# Patient Record
Sex: Female | Born: 1950 | Race: White | Hispanic: No | Marital: Married | Smoking: Never smoker
Health system: Southern US, Community
[De-identification: ages and names within clinical notes are randomized; demographics above are authoritative.]

## PROBLEM LIST (undated history)

## (undated) DIAGNOSIS — E079 Disorder of thyroid, unspecified: Secondary | ICD-10-CM

## (undated) DIAGNOSIS — I4891 Unspecified atrial fibrillation: Secondary | ICD-10-CM

## (undated) DIAGNOSIS — K219 Gastro-esophageal reflux disease without esophagitis: Secondary | ICD-10-CM

## (undated) HISTORY — DX: Unspecified atrial fibrillation: I48.91

## (undated) HISTORY — DX: Disorder of thyroid, unspecified: E07.9

## (undated) HISTORY — PX: APPENDECTOMY: SHX54

## (undated) HISTORY — DX: Gastro-esophageal reflux disease without esophagitis: K21.9

---

## 2016-12-12 ENCOUNTER — Encounter: Payer: Self-pay | Admitting: Family

## 2016-12-12 ENCOUNTER — Ambulatory Visit (INDEPENDENT_AMBULATORY_CARE_PROVIDER_SITE_OTHER): Payer: BLUE CROSS/BLUE SHIELD | Admitting: Family

## 2016-12-12 VITALS — BP 130/70 | HR 80 | Temp 98.2°F | Ht 62.25 in | Wt 149.8 lb

## 2016-12-12 DIAGNOSIS — M1990 Unspecified osteoarthritis, unspecified site: Secondary | ICD-10-CM

## 2016-12-12 DIAGNOSIS — Z1231 Encounter for screening mammogram for malignant neoplasm of breast: Secondary | ICD-10-CM | POA: Diagnosis not present

## 2016-12-12 DIAGNOSIS — E039 Hypothyroidism, unspecified: Secondary | ICD-10-CM | POA: Diagnosis not present

## 2016-12-12 DIAGNOSIS — I7300 Raynaud's syndrome without gangrene: Secondary | ICD-10-CM | POA: Insufficient documentation

## 2016-12-12 DIAGNOSIS — L309 Dermatitis, unspecified: Secondary | ICD-10-CM | POA: Diagnosis not present

## 2016-12-12 DIAGNOSIS — Z1239 Encounter for other screening for malignant neoplasm of breast: Secondary | ICD-10-CM

## 2016-12-12 NOTE — Progress Notes (Signed)
Subjective:    Patient ID: Alexis Mcintosh, female    DOB: 05-17-1951, 66 y.o.   MRN: [id number]  CC: Alexis Mcintosh is a 66 y.o. female who presents today to establish care.    HPI: CC: right thumb trigger finger, waxing and waning.   Has been doing a lot of yard work 'but not sure  What happened.' No injury to her knowledge. Trying to squeezing motions makes it worse. Ibuprofen with some relief. No swelling, fever, numbness, tingling.    raynauds- on nifedia with resolve. Takes 3 per week.   Hypothyroidism- on synthroid. No cold/heat intolerance.   Eczema- stable on current topical corticosteriods.     Will return for CPE       HISTORY:  Past Medical History:  Diagnosis Date  . Thyroid disease    Past Surgical History:  Procedure Laterality Date  . APPENDECTOMY     Family History  Problem Relation Age of Onset  . Cancer Mother        Breast  . Hypertension Mother   . Diabetes Mother   . Stroke Father   . Hypertension Father     Allergies: Amoxicillin No current outpatient prescriptions on file prior to visit.   No current facility-administered medications on file prior to visit.     Social History  Substance Use Topics  . Smoking status: Never Smoker  . Smokeless tobacco: Never Used  . Alcohol use Yes    Review of Systems  Constitutional: Negative for chills and fever.  Respiratory: Negative for cough.   Cardiovascular: Negative for chest pain and palpitations.  Gastrointestinal: Negative for nausea and vomiting.  Endocrine: Negative for cold intolerance and heat intolerance.  Musculoskeletal: Negative for joint swelling.  Neurological: Negative for numbness.      Objective:    BP 130/70   Pulse 80   Temp 98.2 F (36.8 C) (Oral)   Ht 5' 2.25" (1.581 m)   Wt 149 lb 12.8 oz (67.9 kg)   SpO2 96%   BMI 27.18 kg/m  BP Readings from Last 3 Encounters:  12/12/16 130/70   Wt Readings from Last 3 Encounters:  12/12/16 149 lb 12.8 oz (67.9 kg)      Physical Exam  Constitutional: She appears well-developed and well-nourished.  Eyes: Conjunctivae are normal.  Cardiovascular: Normal rate, regular rhythm, normal heart sounds and normal pulses.   Pulmonary/Chest: Effort normal and breath sounds normal. She has no wheezes. She has no rhonchi. She has no rales.  Musculoskeletal:       Right hand: She exhibits tenderness and bony tenderness. She exhibits normal range of motion, normal capillary refill, no deformity and no swelling. Normal sensation noted.       Hands: Mild tenderness over CMJ joint. No swelling, increased warmth, erythema.   Negative phalens, tinels.   Neurological: She is alert.  Skin: Skin is warm and dry.  Psychiatric: She has a normal mood and affect. Her speech is normal and behavior is normal. Thought content normal.  Vitals reviewed.      Assessment & Plan:   Problem List Items Addressed This Visit      Endocrine   Hypothyroidism    Clinically asymptomatic. Will follow.         Musculoskeletal and Integument   Eczema   Arthritis    Symptoms consistent with CMJ arthritis. advised conservative treatement. Patient will let me know if not better.       Relevant Medications   Fenoprofen Calcium (NALFON)  200 MG CAPS capsule     Other   Raynaud phenomenon    Symptomatically stable. Will follow.       Screening for breast cancer - Primary    Ordered. Patient will schedule .      Relevant Orders   MM DIGITAL SCREENING BILATERAL       I am having Ms. Alexis Mcintosh maintain her Desonide Lot-Moisturizing Crea, triamcinolone cream, Fenoprofen Calcium, Fish Oil, Misc Natural Products (GLUCOSAMINE CHONDROITIN MSM PO), Coenzyme Q10, Vitamin D3, Melatonin, and multivitamin.   Meds ordered this encounter  Medications  . DISCONTD: SYNTHROID 125 MCG tablet  . DISCONTD: omeprazole (PRILOSEC) 20 MG capsule  . DISCONTD: NIFEdipine (PROCARDIA-XL/ADALAT-CC/NIFEDICAL-XL) 30 MG 24 hr tablet  . DISCONTD:  hydrOXYzine (ATARAX/VISTARIL) 25 MG tablet    Sig: Take 25 mg by mouth 3 (three) times daily as needed.  Marland Kitchen DISCONTD: pseudoephedrine (SUDAFED) 120 MG 12 hr tablet    Sig: Take 120 mg by mouth 2 (two) times daily.  Marland Kitchen Desonide Lot-Moisturizing Crea 0.05 % KIT    Sig: Apply topically.  Marland Kitchen DISCONTD: mometasone-formoterol (DULERA) 100-5 MCG/ACT AERO    Sig: Inhale 2 puffs into the lungs 2 (two) times daily.  Marland Kitchen triamcinolone cream (KENALOG) 0.1 %    Sig: Apply 1 application topically 2 (two) times daily.  Marland Kitchen DISCONTD: fexofenadine (ALLEGRA) 180 MG tablet    Sig: Take 180 mg by mouth daily.  . Fenoprofen Calcium (NALFON) 200 MG CAPS capsule    Sig: Take 200 mg by mouth.  . Omega-3 Fatty Acids (FISH OIL) 1000 MG CAPS    Sig: Take by mouth.  . Misc Natural Products (GLUCOSAMINE CHONDROITIN MSM PO)    Sig: Take 1,500 mg by mouth.  . Coenzyme Q10 (CVS COQ-10) 200 MG capsule    Sig: Take 200 mg by mouth daily.  . Cholecalciferol (VITAMIN D3) 2000 units TABS    Sig: Take by mouth.  . Melatonin 5 MG TABS    Sig: Take by mouth.  . Multiple Vitamin (MULTIVITAMIN) tablet    Sig: Take 1 tablet by mouth daily.    Return precautions given.   Risks, benefits, and alternatives of the medications and treatment plan prescribed today were discussed, and patient expressed understanding.   Education regarding symptom management and diagnosis given to patient on AVS.  Continue to follow with Alexis Hawthorne, FNP for routine health maintenance.   Alexis Mcintosh and I agreed with plan.   Alexis Paris, FNP

## 2016-12-12 NOTE — Patient Instructions (Addendum)
So nice meeting you  Conservative therapy for thumb- let me know if not better  Follow up with physical and fasting labs.  We placed a referral. Mammogram this year. I asked that you call one the below locations and schedule this when it is convenient for you.   If you have dense breasts, you may ask for 3D mammogram over the traditional 2D mammogram as new evidence suggest 3D is superior. Please note that NOT all insurance companies cover 3D and you may have to pay a higher copay. You may call your insurance company to further clarify your benefits.   Options for Mammogram.    [facility]  [address]  [place], [place]  [phone]  * [facility]   [facility] [address] [place], [place] [phone] * Note if you ask for 3D mammogram at this location, you must request [place], [place] location*

## 2016-12-12 NOTE — Assessment & Plan Note (Addendum)
Symptoms consistent with CMJ arthritis. advised conservative treatement. Patient will let me know if not better.

## 2016-12-13 ENCOUNTER — Other Ambulatory Visit: Payer: Self-pay

## 2016-12-13 ENCOUNTER — Encounter: Payer: Self-pay | Admitting: Family

## 2016-12-13 MED ORDER — HYDROXYZINE HCL 25 MG PO TABS: 25.0000 mg | ORAL_TABLET | Freq: Three times a day (TID) | ORAL | 1 refills | Status: DC | PRN

## 2016-12-13 MED ORDER — OMEPRAZOLE 20 MG PO CPDR: 20.0000 mg | DELAYED_RELEASE_CAPSULE | Freq: Every day | ORAL | 1 refills | Status: DC

## 2016-12-13 MED ORDER — PSEUDOEPHEDRINE HCL ER 120 MG PO TB12: 120.0000 mg | ORAL_TABLET | Freq: Two times a day (BID) | ORAL | 1 refills | Status: DC

## 2016-12-13 MED ORDER — SYNTHROID 125 MCG PO TABS: 125.0000 ug | ORAL_TABLET | Freq: Every day | ORAL | 1 refills | Status: DC

## 2016-12-13 MED ORDER — MOMETASONE FURO-FORMOTEROL FUM 100-5 MCG/ACT IN AERO: 2.0000 | INHALATION_SPRAY | Freq: Two times a day (BID) | RESPIRATORY_TRACT | 3 refills | Status: DC

## 2016-12-13 MED ORDER — NIFEDIPINE ER OSMOTIC RELEASE 30 MG PO TB24: 30.0000 mg | ORAL_TABLET | Freq: Every day | ORAL | 1 refills | Status: DC

## 2016-12-13 MED ORDER — FEXOFENADINE HCL 180 MG PO TABS: 180.0000 mg | ORAL_TABLET | Freq: Every day | ORAL | 1 refills | Status: DC

## 2016-12-13 NOTE — Telephone Encounter (Signed)
Medication has been refilled.

## 2016-12-16 ENCOUNTER — Encounter: Payer: Self-pay | Admitting: Family

## 2016-12-16 DIAGNOSIS — Z1239 Encounter for other screening for malignant neoplasm of breast: Secondary | ICD-10-CM | POA: Insufficient documentation

## 2016-12-16 NOTE — Assessment & Plan Note (Signed)
Ordered.  Patient will schedule. 

## 2016-12-16 NOTE — Assessment & Plan Note (Signed)
Clinically asymptomatic. Will follow 

## 2016-12-16 NOTE — Assessment & Plan Note (Signed)
Symptomatically stable. Will follow 

## 2016-12-24 ENCOUNTER — Telehealth: Payer: Self-pay | Admitting: Family

## 2016-12-24 MED ORDER — PSEUDOEPHEDRINE HCL ER 120 MG PO TB12: 120.0000 mg | ORAL_TABLET | Freq: Two times a day (BID) | ORAL | 1 refills | Status: DC

## 2016-12-24 NOTE — Telephone Encounter (Signed)
Refill sent as requested , patient notified,

## 2016-12-24 NOTE — Telephone Encounter (Signed)
Pt needs a refill for pseudoephedrine (SUDAFED) 120 MG 12 hr tablet 90 day supply  Pharmacy is [id number] - [place], [place] - [address]  Call pt @ [phone]. Thank you!

## 2017-01-13 NOTE — Telephone Encounter (Signed)
Awaiting mychart reply.

## 2017-01-13 NOTE — Telephone Encounter (Signed)
Pt would like to have all of her medications in generic form please. Thank you!

## 2017-01-15 ENCOUNTER — Telehealth: Payer: Self-pay | Admitting: Family

## 2017-01-15 NOTE — Telephone Encounter (Signed)
Spoken to patient she is calling her last PCP in [place] for date then have records sent to us here.

## 2017-01-15 NOTE — Telephone Encounter (Signed)
Pt has an order to get a mammo. I need to know pt last mammo. Pt states [place] was in [place] but not sure of the year. Would you know the year she got her last mammo? Thank you!

## 2017-01-15 NOTE — Telephone Encounter (Signed)
Ok. Yes I spoke to her about getting that information to. Pt stated that we should've had her records also, that's why I wanted to check if we had any information. Ok! Thank you so much!

## 2017-01-16 MED ORDER — OMEPRAZOLE 20 MG PO CPDR: 20.0000 mg | DELAYED_RELEASE_CAPSULE | Freq: Every day | ORAL | 1 refills | Status: DC

## 2017-01-16 MED ORDER — LEVOTHYROXINE SODIUM 125 MCG PO TABS: 125.0000 ug | ORAL_TABLET | Freq: Every day | ORAL | 1 refills | Status: DC

## 2017-01-16 NOTE — Telephone Encounter (Signed)
Medication has been refilled and changed to generic.

## 2017-03-11 ENCOUNTER — Telehealth: Payer: Self-pay | Admitting: Family

## 2017-03-11 DIAGNOSIS — L309 Dermatitis, unspecified: Secondary | ICD-10-CM

## 2017-03-11 DIAGNOSIS — J45909 Unspecified asthma, uncomplicated: Secondary | ICD-10-CM

## 2017-03-11 NOTE — Telephone Encounter (Signed)
Pt called about needing refill for mometasone-formoterol (DULERA) 100-5 MCG/ACT AERO, triamcinolone cream (KENALOG) 0.1 %.  [place] [place] [facility] [place] [address]  Call pt @ [phone]. Thank you!

## 2017-03-11 NOTE — Telephone Encounter (Signed)
Last seen 15may2018. Please advise.

## 2017-03-12 MED ORDER — TRIAMCINOLONE ACETONIDE 0.1 % EX CREA: 1.0000 "application " | TOPICAL_CREAM | Freq: Two times a day (BID) | CUTANEOUS | 1 refills | Status: DC

## 2017-03-12 MED ORDER — MOMETASONE FURO-FORMOTEROL FUM 100-5 MCG/ACT IN AERO: 2.0000 | INHALATION_SPRAY | Freq: Two times a day (BID) | RESPIRATORY_TRACT | 3 refills | Status: DC

## 2017-03-12 NOTE — Telephone Encounter (Signed)
Left message for patient to return call back.  

## 2017-03-12 NOTE — Telephone Encounter (Signed)
Call pt-  Need more info   I presume she uses dulera for asthma ?  What is the steroid cream used for ?  That is rather high dose steroid cream. Please ensure she is aware that steroids Can cause skin discoloration and should not be used on face.

## 2017-03-12 NOTE — Telephone Encounter (Signed)
Please call pt at [phone]

## 2017-03-12 NOTE — Telephone Encounter (Signed)
Patient uses steroid cream for eczema and the nasonex for breathing.

## 2017-03-12 NOTE — Telephone Encounter (Signed)
refilled 

## 2017-03-18 MED ORDER — MOMETASONE FURO-FORMOTEROL FUM 100-5 MCG/ACT IN AERO: 2.0000 | INHALATION_SPRAY | Freq: Two times a day (BID) | RESPIRATORY_TRACT | 3 refills | Status: DC

## 2017-03-18 MED ORDER — TRIAMCINOLONE ACETONIDE 0.1 % EX CREA: 1.0000 "application " | TOPICAL_CREAM | Freq: Two times a day (BID) | CUTANEOUS | 1 refills | Status: DC

## 2017-03-18 NOTE — Telephone Encounter (Signed)
[facility]

## 2017-03-18 NOTE — Telephone Encounter (Signed)
Order has been resent to escript[facility]

## 2017-03-25 ENCOUNTER — Ambulatory Visit
Admission: RE | Admit: 2017-03-25 | Discharge: 2017-03-25 | Disposition: A | Discharge status: Home / Self Care | Payer: BLUE CROSS/BLUE SHIELD | Source: Ambulatory Visit | Attending: Family | Admitting: Family

## 2017-03-25 ENCOUNTER — Telehealth: Payer: Self-pay | Admitting: Family

## 2017-03-25 ENCOUNTER — Encounter: Payer: Self-pay | Admitting: Radiology

## 2017-03-25 DIAGNOSIS — Z1231 Encounter for screening mammogram for malignant neoplasm of breast: Secondary | ICD-10-CM | POA: Insufficient documentation

## 2017-03-25 DIAGNOSIS — Z1239 Encounter for other screening for malignant neoplasm of breast: Secondary | ICD-10-CM

## 2017-03-25 MED ORDER — MOMETASONE FUROATE 50 MCG/ACT NA SUSP: 2.0000 | Freq: Every day | NASAL | 3 refills | Status: DC

## 2017-03-25 NOTE — Telephone Encounter (Signed)
Pt called back and stated that she cannot returning that medication. Please advise, thank you!

## 2017-03-25 NOTE — Telephone Encounter (Signed)
Patient came into facility stating she received Dulera Inhaler instead of Nasonex nasal spray, reviewed chart patient has no HX of asthma or COPD, patient ask that medication be sent to pharmacy while she was here for Nasonex spoke with provider and was given verbal ok to fill Nasonex.

## 2017-03-26 NOTE — Telephone Encounter (Signed)
Safety zone portal completed yesterday. thanks

## 2017-04-01 ENCOUNTER — Other Ambulatory Visit: Payer: Self-pay | Admitting: *Deleted

## 2017-04-01 ENCOUNTER — Inpatient Hospital Stay
Admission: RE | Admit: 2017-04-01 | Discharge: 2017-04-01 | Disposition: A | Discharge status: Home / Self Care | Payer: Self-pay | Source: Ambulatory Visit | Attending: *Deleted | Admitting: *Deleted

## 2017-04-01 DIAGNOSIS — Z9289 Personal history of other medical treatment: Secondary | ICD-10-CM

## 2017-06-02 ENCOUNTER — Other Ambulatory Visit: Payer: Self-pay | Admitting: Family

## 2017-06-10 ENCOUNTER — Telehealth: Payer: Self-pay | Admitting: Family

## 2017-06-10 DIAGNOSIS — E039 Hypothyroidism, unspecified: Secondary | ICD-10-CM

## 2017-06-10 NOTE — Telephone Encounter (Signed)
Copied from CRM #[id number]. Topic: General - Other >> Jun 10, 2017  4:35 PM Clack, Jessica D wrote: Reason for CRM: Patient is requesting a med refill on her levothyroxine to be sent to [facility] - [place], [place] - [address] [phone] (Phone) [phone] (Fax)   

## 2017-06-10 NOTE — Telephone Encounter (Signed)
Please advise 

## 2017-06-11 MED ORDER — LEVOTHYROXINE SODIUM 125 MCG PO TABS: 125.0000 ug | ORAL_TABLET | Freq: Every day | ORAL | 1 refills | Status: DC

## 2017-06-11 NOTE — Telephone Encounter (Signed)
Copied from CRM [phone]. Topic: General - Other >> Jun 10, 2017  4:35 PM Clack, Princella PellegriniJessica D wrote: Reason for CRM: Patient is requesting a med refill on her [facility]othyroxine [facility] [place] [address] [phone] (Phone) [phone] (Fax)

## 2017-06-11 NOTE — Telephone Encounter (Signed)
rx sent  No recent tsh. Please make her a f/u appt with colleague to ensure doing well on current dose and have tsh checked

## 2017-06-12 NOTE — Telephone Encounter (Signed)
Please schedule patient for follow up.

## 2017-06-16 ENCOUNTER — Ambulatory Visit (INDEPENDENT_AMBULATORY_CARE_PROVIDER_SITE_OTHER): Payer: BLUE CROSS/BLUE SHIELD | Admitting: Family

## 2017-06-16 ENCOUNTER — Encounter: Payer: Self-pay | Admitting: Family

## 2017-06-16 VITALS — BP 122/68 | HR 83 | Temp 98.3°F | Ht 62.0 in | Wt 148.4 lb

## 2017-06-16 DIAGNOSIS — L309 Dermatitis, unspecified: Secondary | ICD-10-CM

## 2017-06-16 DIAGNOSIS — I7300 Raynaud's syndrome without gangrene: Secondary | ICD-10-CM | POA: Diagnosis not present

## 2017-06-16 DIAGNOSIS — J302 Other seasonal allergic rhinitis: Secondary | ICD-10-CM | POA: Diagnosis not present

## 2017-06-16 DIAGNOSIS — K219 Gastro-esophageal reflux disease without esophagitis: Secondary | ICD-10-CM | POA: Insufficient documentation

## 2017-06-16 DIAGNOSIS — E039 Hypothyroidism, unspecified: Secondary | ICD-10-CM | POA: Diagnosis not present

## 2017-06-16 DIAGNOSIS — L509 Urticaria, unspecified: Secondary | ICD-10-CM

## 2017-06-16 DIAGNOSIS — L989 Disorder of the skin and subcutaneous tissue, unspecified: Secondary | ICD-10-CM | POA: Diagnosis not present

## 2017-06-16 MED ORDER — FEXOFENADINE HCL 180 MG PO TABS: 180.0000 mg | ORAL_TABLET | Freq: Every day | ORAL | 1 refills | Status: DC

## 2017-06-16 MED ORDER — LEVOTHYROXINE SODIUM 125 MCG PO TABS: 125.0000 ug | ORAL_TABLET | Freq: Every day | ORAL | 1 refills | Status: DC

## 2017-06-16 MED ORDER — PSEUDOEPHEDRINE HCL ER 120 MG PO TB12: 120.0000 mg | ORAL_TABLET | Freq: Two times a day (BID) | ORAL | 1 refills | Status: DC

## 2017-06-16 MED ORDER — MOMETASONE FUROATE 50 MCG/ACT NA SUSP: 2.0000 | Freq: Every day | NASAL | 3 refills | Status: DC

## 2017-06-16 MED ORDER — TRIAMCINOLONE ACETONIDE 0.1 % EX CREA: 1.0000 "application " | TOPICAL_CREAM | Freq: Two times a day (BID) | CUTANEOUS | 1 refills | Status: DC

## 2017-06-16 MED ORDER — NIFEDIPINE ER OSMOTIC RELEASE 30 MG PO TB24: 30.0000 mg | ORAL_TABLET | Freq: Every day | ORAL | 1 refills | Status: DC

## 2017-06-16 MED ORDER — DESONIDE 0.05 % EX LOTN: TOPICAL_LOTION | Freq: Two times a day (BID) | CUTANEOUS | 0 refills | Status: DC

## 2017-06-16 MED ORDER — HYDROXYZINE HCL 50 MG PO TABS: 50.0000 mg | ORAL_TABLET | Freq: Every day | ORAL | 2 refills | Status: DC

## 2017-06-16 NOTE — Assessment & Plan Note (Signed)
Stable. Education provided on long-term use of PPIs. Declines EGD at this time. Advised patient to consider in the future.

## 2017-06-16 NOTE — Assessment & Plan Note (Signed)
Controlled on current regimen. Refilled topical corticosteroids

## 2017-06-16 NOTE — Addendum Note (Signed)
Addended by: Elise BenneBOOTH, Duy Lemming T on: 06/16/2017 03:11 PM   Modules accepted: Orders

## 2017-06-16 NOTE — Progress Notes (Signed)
Subjective:    Patient ID: Natacia Chaisson, female    DOB: 1951/02/16, 66 y.o.   MRN: [id number]  CC: Bettyanne Dittman is a 66 y.o. female who presents today for follow up.   HPI: Two new lesions on right lower extremity.  Not bleeding, nor itchy.   No h/o skin cancer  PGM had skin cancer.   GERD- on prilosec every day. Last EGD several years. No hoarseness, trouble swallowing. Non smoker.   Would like refills on all medications for one year.  Hives- chronic. Takes Atarax daily. States it is not sedating for her. Hypothyroidism-complaint with medication. No weight changes, cold/ heat intolerance          HISTORY:  Past Medical History:  Diagnosis Date  . Thyroid disease    Past Surgical History:  Procedure Laterality Date  . APPENDECTOMY     Family History  Problem Relation Age of Onset  . Cancer Mother        Breast  . Hypertension Mother   . Diabetes Mother   . Breast cancer Mother 47  . Stroke Father   . Hypertension Father   . Breast cancer Paternal Grandmother 90    Allergies: Amoxicillin Current Outpatient Medications on File Prior to Visit  Medication Sig Dispense Refill  . Cholecalciferol (VITAMIN D3) 2000 units TABS Take by mouth.    . Coenzyme Q10 (CVS COQ-10) 200 MG capsule Take 200 mg by mouth daily.    Marland Kitchen Desonide Lot-Moisturizing Crea 0.05 % KIT Apply topically.    . Fenoprofen Calcium (NALFON) 200 MG CAPS capsule Take 200 mg by mouth.    . Melatonin 5 MG TABS Take by mouth.    . Misc Natural Products (GLUCOSAMINE CHONDROITIN MSM PO) Take 1,500 mg by mouth.    . Multiple Vitamin (MULTIVITAMIN) tablet Take 1 tablet by mouth daily.    . Omega-3 Fatty Acids (FISH OIL) 1000 MG CAPS Take by mouth.    Marland Kitchen omeprazole (PRILOSEC) 20 MG capsule Take 1 capsule (20 mg total) by mouth daily. 90 capsule 1   No current facility-administered medications on file prior to visit.     Social History   Tobacco Use  . Smoking status: Never Smoker  . Smokeless  tobacco: Never Used  Substance Use Topics  . Alcohol use: Yes  . Drug use: No    Review of Systems  Constitutional: Negative for chills and fever.  HENT: Negative for trouble swallowing.   Respiratory: Negative for cough.   Cardiovascular: Negative for chest pain and palpitations.  Gastrointestinal: Negative for nausea and vomiting.  Endocrine: Negative for cold intolerance and heat intolerance.  Skin: Positive for color change.      Objective:    BP 122/68   Pulse 83   Temp 98.3 F (36.8 C) (Oral)   Ht _0  (1.575 m)   Wt 148 lb 6.4 oz (67.3 kg)   SpO2 97%   BMI 27.14 kg/m  BP Readings from Last 3 Encounters:  06/16/17 122/68  12/12/16 130/70   Wt Readings from Last 3 Encounters:  06/16/17 148 lb 6.4 oz (67.3 kg)  12/12/16 149 lb 12.8 oz (67.9 kg)    Physical Exam  Constitutional: She appears well-developed and well-nourished.  Eyes: Conjunctivae are normal.  Cardiovascular: Normal rate, regular rhythm, normal heart sounds and normal pulses.  Pulmonary/Chest: Effort normal and breath sounds normal. She has no wheezes. She has no rhonchi. She has no rales.  Neurological: She is alert.  Skin: Skin  is warm and dry.     Brown colored macular lesions noted as on diagram  Psychiatric: She has a normal mood and affect. Her speech is normal and behavior is normal. Thought content normal.  Vitals reviewed.      Assessment & Plan:   Problem List Items Addressed This Visit      Digestive   GERD (gastroesophageal reflux disease)    Stable. Education provided on long-term use of PPIs. Declines EGD at this time. Advised patient to consider in the future.        Endocrine   Hypothyroidism    Asymptomatic. Prefers to do thyroid studies with physical next year      Relevant Medications   levothyroxine (SYNTHROID, LEVOTHROID) 125 MCG tablet     Musculoskeletal and Integument   Eczema    Controlled on current regimen. Refilled topical corticosteroids       Relevant Medications   triamcinolone cream (KENALOG) 0.1 %   desonide (DESOWEN) 0.05 % lotion   Hives - Primary    Controlled on daily dose of Atarax. We'll continue      Relevant Medications   hydrOXYzine (ATARAX/VISTARIL) 50 MG tablet   Skin lesion    New. Advised dermatology consult to ensure no biopsy needed. Patient declines at this time will let me know if changes        Other   Raynaud phenomenon   Relevant Medications   NIFEdipine (PROCARDIA-XL/ADALAT-CC/NIFEDICAL-XL) 30 MG 24 hr tablet    Other Visit Diagnoses    Seasonal allergies       Relevant Medications   pseudoephedrine (SUDAFED) 120 MG 12 hr tablet   mometasone (NASONEX) 50 MCG/ACT nasal spray   fexofenadine (ALLEGRA) 180 MG tablet       I have discontinued Dorine Dekker's hydrOXYzine. I have also changed her NIFEdipine, pseudoephedrine, triamcinolone cream, mometasone, and fexofenadine. Additionally, I am having her start on hydrOXYzine and desonide. Lastly, I am having her maintain her Desonide Lot-Moisturizing Crea, Fenoprofen Calcium, Fish Oil, Misc Natural Products (GLUCOSAMINE CHONDROITIN MSM PO), Coenzyme Q10, Vitamin D3, Melatonin, multivitamin, omeprazole, and levothyroxine.   Meds ordered this encounter  Medications  . levothyroxine (SYNTHROID, LEVOTHROID) 125 MCG tablet    Sig: Take 1 tablet (125 mcg total) daily by mouth.    Dispense:  90 tablet    Refill:  1    Order Specific Question:   Supervising Provider    Answer:   Deborra Medina L [[id number]]  . NIFEdipine (PROCARDIA-XL/ADALAT-CC/NIFEDICAL-XL) 30 MG 24 hr tablet    Sig: Take 1 tablet (30 mg total) daily by mouth.    Dispense:  90 tablet    Refill:  1    Order Specific Question:   Supervising Provider    Answer:   Deborra Medina L [[id number]]  . hydrOXYzine (ATARAX/VISTARIL) 50 MG tablet    Sig: Take 1 tablet (50 mg total) daily by mouth.    Dispense:  90 tablet    Refill:  2    Order Specific Question:   Supervising Provider    Answer:    Deborra Medina L [[id number]]  . pseudoephedrine (SUDAFED) 120 MG 12 hr tablet    Sig: Take 1 tablet (120 mg total) 2 (two) times daily by mouth.    Dispense:  180 tablet    Refill:  1    Order Specific Question:   Supervising Provider    Answer:   Deborra Medina L [[id number]]  . triamcinolone cream (KENALOG) 0.1 %    Sig:  Apply 1 application 2 (two) times daily topically.    Dispense:  30 g    Refill:  1    Order Specific Question:   Supervising Provider    Answer:   Derrel Nip, TERESA L [[id number]]  . mometasone (NASONEX) 50 MCG/ACT nasal spray    Sig: Place 2 sprays daily into the nose.    Dispense:  51 g    Refill:  3    Order Specific Question:   Supervising Provider    Answer:   Derrel Nip, TERESA L [[id number]]  . fexofenadine (ALLEGRA) 180 MG tablet    Sig: Take 1 tablet (180 mg total) daily by mouth.    Dispense:  90 tablet    Refill:  1    Order Specific Question:   Supervising Provider    Answer:   Deborra Medina L [[id number]]  . desonide (DESOWEN) 0.05 % lotion    Sig: Apply 2 (two) times daily topically.    Dispense:  59 mL    Refill:  0    Order Specific Question:   Supervising Provider    Answer:   Crecencio Mc [[id number]]    Return precautions given.   Risks, benefits, and alternatives of the medications and treatment plan prescribed today were discussed, and patient expressed understanding.   Education regarding symptom management and diagnosis given to patient on AVS.  Continue to follow with Burnard Hawthorne, FNP for routine health maintenance.   Clayborne Dana and I agreed with plan.   Mable Paris, FNP

## 2017-06-16 NOTE — Progress Notes (Signed)
Pre visit review using our clinic review tool, if applicable. No additional management support is needed unless otherwise documented below in the visit note. 

## 2017-06-16 NOTE — Assessment & Plan Note (Signed)
Controlled on daily dose of Atarax. We'll continue

## 2017-06-16 NOTE — Patient Instructions (Addendum)
Long term use beyond 3 months of proton pump inhibitors , also called PPI's, is associated with malabsorption of vitamins, chronic kidney disease, fracture risk, and diarrheal illnesses. PPI's include Nexium, Prilosec, Protonix, Dexilant, and Prevacid.   I generally recommend trying to control acid reflux with lifestyle modifications including avoiding trigger foods, not eating 2 hours prior to bedtime. You may use histamine 2 blockers daily to twice daily ( this is Zantac, Pepcid) and then when symptoms flare, start back on PPI for short course.    Let me know about EGD and let me know dermatologist as well.

## 2017-06-16 NOTE — Assessment & Plan Note (Signed)
New. Advised dermatology consult to ensure no biopsy needed. Patient declines at this time will let me know if changes

## 2017-06-16 NOTE — Assessment & Plan Note (Signed)
Asymptomatic. Prefers to do thyroid studies with physical next year

## 2017-08-01 ENCOUNTER — Other Ambulatory Visit: Payer: Self-pay | Admitting: Family

## 2017-11-26 ENCOUNTER — Other Ambulatory Visit: Payer: Self-pay | Admitting: Family

## 2017-11-26 DIAGNOSIS — J302 Other seasonal allergic rhinitis: Secondary | ICD-10-CM

## 2018-01-28 ENCOUNTER — Other Ambulatory Visit: Payer: Self-pay | Admitting: Family

## 2018-02-13 ENCOUNTER — Other Ambulatory Visit: Payer: Self-pay | Admitting: Family

## 2018-02-13 DIAGNOSIS — E039 Hypothyroidism, unspecified: Secondary | ICD-10-CM

## 2018-04-29 DIAGNOSIS — R69 Illness, unspecified: Secondary | ICD-10-CM | POA: Diagnosis not present

## 2018-05-14 ENCOUNTER — Other Ambulatory Visit: Payer: Self-pay | Admitting: Family

## 2018-05-14 DIAGNOSIS — E039 Hypothyroidism, unspecified: Secondary | ICD-10-CM

## 2018-06-01 ENCOUNTER — Telehealth: Payer: Self-pay | Admitting: Family

## 2018-06-01 ENCOUNTER — Other Ambulatory Visit: Payer: Self-pay | Admitting: Family

## 2018-06-01 DIAGNOSIS — Z1231 Encounter for screening mammogram for malignant neoplasm of breast: Secondary | ICD-10-CM

## 2018-06-01 DIAGNOSIS — L509 Urticaria, unspecified: Secondary | ICD-10-CM

## 2018-06-01 NOTE — Telephone Encounter (Signed)
Copied from CRM [id number]. Topic: Quick Communication - See Telephone Encounter >> Jun 01, 2018  2:03 PM Luanna Cole wrote: CRM for notification. See Telephone encounter for: 06/01/18. Pt called and stated that she needs a prior authorization for medication hydrOXYzine (ATARAX/VISTARIL) 50 MG tablet [[id number]] pt states that insurance said it was a DUR rejection and the provider would need to call [phone]. Please advise

## 2018-06-04 NOTE — Telephone Encounter (Signed)
PEC may speak with patient.  Left message for patient to call , need to speak with patient in regards to prior authorization to find out what pharmacy she is using. Need member ID to precede with prior authorization. Thanks

## 2018-06-04 NOTE — Telephone Encounter (Signed)
Member Id-[id number] Pharmacy [facility] [place], [place].

## 2018-06-05 NOTE — Telephone Encounter (Signed)
Prior authorization submitted.

## 2018-06-08 DIAGNOSIS — H04123 Dry eye syndrome of bilateral lacrimal glands: Secondary | ICD-10-CM | POA: Diagnosis not present

## 2018-06-08 NOTE — Telephone Encounter (Signed)
Pt states that she is calling to check on this since she hasn't heard and states she wants to confirm that this is for 2 pills a day and a 3 month RX.

## 2018-06-09 NOTE — Telephone Encounter (Signed)
Patient comes by office states she is completely out of hydroxyzine . She states she takes two per day. Wonders we can call in script for hydroxyzine 50mg  bid to [facility] [address] . She will pay out of pocket until we hear from prior authorization. Thanks.

## 2018-06-09 NOTE — Telephone Encounter (Signed)
Since patient has not been seen at office since November 2018 and I have never met her, I will forward to [facility] to see if she will send in.  I am unsure why exactly she is taking it so I am uncomfortable sending.

## 2018-06-09 NOTE — Telephone Encounter (Signed)
Left voicemail for patient that message has been forwarded to Claris Che , NP and she is out of office.

## 2018-06-10 MED ORDER — HYDROXYZINE HCL 50 MG PO TABS: 50.0000 mg | ORAL_TABLET | Freq: Every day | ORAL | 0 refills | Status: DC

## 2018-06-10 NOTE — Telephone Encounter (Signed)
Left voicemail for patient to contact office

## 2018-06-10 NOTE — Telephone Encounter (Signed)
Prior authorization approved. Prior authorization approved for one tablet daily .

## 2018-06-10 NOTE — Telephone Encounter (Signed)
Call pt I have given her 90-day supply of the Atarax until she is seen.  However please explain that she will need to follow-up with me.  For me to prescribe medications, need to see her at least annually.  Please schedule follow-up.

## 2018-06-15 NOTE — Telephone Encounter (Signed)
F/u appt 07/08/18

## 2018-06-19 ENCOUNTER — Other Ambulatory Visit: Payer: Self-pay | Admitting: Family

## 2018-06-19 DIAGNOSIS — L509 Urticaria, unspecified: Secondary | ICD-10-CM

## 2018-07-08 ENCOUNTER — Other Ambulatory Visit: Payer: Self-pay

## 2018-07-08 ENCOUNTER — Encounter: Payer: Self-pay | Admitting: Family

## 2018-07-08 ENCOUNTER — Ambulatory Visit (INDEPENDENT_AMBULATORY_CARE_PROVIDER_SITE_OTHER): Payer: Medicare HMO | Admitting: Family

## 2018-07-08 VITALS — BP 118/72 | HR 72 | Wt 154.0 lb

## 2018-07-08 DIAGNOSIS — J302 Other seasonal allergic rhinitis: Secondary | ICD-10-CM

## 2018-07-08 DIAGNOSIS — E039 Hypothyroidism, unspecified: Secondary | ICD-10-CM | POA: Diagnosis not present

## 2018-07-08 DIAGNOSIS — Z23 Encounter for immunization: Secondary | ICD-10-CM

## 2018-07-08 DIAGNOSIS — L509 Urticaria, unspecified: Secondary | ICD-10-CM

## 2018-07-08 DIAGNOSIS — I7300 Raynaud's syndrome without gangrene: Secondary | ICD-10-CM | POA: Diagnosis not present

## 2018-07-08 MED ORDER — LEVOTHYROXINE SODIUM 125 MCG PO TABS: 125.0000 ug | ORAL_TABLET | Freq: Every day | ORAL | 3 refills | Status: DC

## 2018-07-08 MED ORDER — NIFEDIPINE ER OSMOTIC RELEASE 30 MG PO TB24: 30.0000 mg | ORAL_TABLET | Freq: Every day | ORAL | 3 refills | Status: DC

## 2018-07-08 MED ORDER — FEXOFENADINE HCL 180 MG PO TABS: 180.0000 mg | ORAL_TABLET | Freq: Every day | ORAL | 3 refills | Status: DC

## 2018-07-08 MED ORDER — HYDROXYZINE HCL 50 MG PO TABS: 100.0000 mg | ORAL_TABLET | Freq: Every day | ORAL | 3 refills | Status: DC

## 2018-07-08 NOTE — Patient Instructions (Signed)
Return for physical   Fasting labs when you can

## 2018-07-08 NOTE — Assessment & Plan Note (Addendum)
Stable on regimen. Discussed being on dual antihistamine regimen. Patient would like to continue regimen as too overly sedating for her. Advised NO more antihistamines, or increase of current regimen without allergy consult. She verbalized understanding .

## 2018-07-08 NOTE — Assessment & Plan Note (Signed)
Stable. Refilled CCB.

## 2018-07-08 NOTE — Assessment & Plan Note (Signed)
Appears stable. Pending tsh

## 2018-07-08 NOTE — Progress Notes (Signed)
Subjective:    Patient ID: Alexis Mcintosh, female    DOB: [birth year], 67 y.o.   MRN: [id number]  CC: Alexis Mcintosh is a 67 y.o. female who presents today for follow up.   HPI: Feels well today. No complaints.   Hives, allergies- started on atarax 152m qhs and allegra qd  by an allergist years ago.  Doesn't feel too sedated on this regimen. Would like refills.   Raynauds- stable. Would like refill of synthroid.   hypothyroidism - compliant with medication.  No cold/ heat intolerance.   Will return for cpe.       Mammogram scheduled.   HISTORY:  Past Medical History:  Diagnosis Date  . Thyroid disease    Past Surgical History:  Procedure Laterality Date  . APPENDECTOMY     Family History  Problem Relation Age of Onset  . Cancer Mother        Breast  . Hypertension Mother   . Diabetes Mother   . Breast cancer Mother 668 . Stroke Father   . Hypertension Father   . Breast cancer Paternal Grandmother 759   Allergies: Amoxicillin Current Outpatient Medications on File Prior to Visit  Medication Sig Dispense Refill  . Cholecalciferol (VITAMIN D3) 2000 units TABS Take by mouth.    . Coenzyme Q10 ([employer]S COQ-10) 200 MG capsule Take 200 mg by mouth daily.    .Marland Kitchendesonide (DESOWEN) 0.05 % lotion Apply 2 (two) times daily topically. 59 mL 0  . Desonide Lot-Moisturizing Crea 0.05 % KIT Apply topically.    . Fenoprofen Calcium (NALFON) 200 MG CAPS capsule Take 200 mg by mouth.    . Melatonin 5 MG TABS Take by mouth.    . Misc Natural Products (GLUCOSAMINE CHONDROITIN MSM PO) Take 1,500 mg by mouth.    . mometasone (NASONEX) 50 MCG/ACT nasal spray Place 2 sprays daily into the nose. 51 g 3  . Multiple Vitamin (MULTIVITAMIN) tablet Take 1 tablet by mouth daily.    . Omega-3 Fatty Acids (FISH OIL) 1000 MG CAPS Take by mouth.    .Marland Kitchenomeprazole (PRILOSEC) 20 MG capsule TAKE 1 CAPSULE DAILY 90 capsule 1  . pseudoephedrine (SUDAFED) 120 MG 12 hr tablet Take 1 tablet (120 mg total) 2  (two) times daily by mouth. 180 tablet 1  . triamcinolone cream (KENALOG) 0.1 % Apply 1 application 2 (two) times daily topically. 30 g 1   No current facility-administered medications on file prior to visit.     Social History   Tobacco Use  . Smoking status: Never Smoker  . Smokeless tobacco: Never Used  Substance Use Topics  . Alcohol use: Yes  . Drug use: No    Review of Systems  Constitutional: Negative for chills and fever.  HENT: Positive for rhinorrhea (seasonal).   Respiratory: Negative for cough.   Cardiovascular: Negative for chest pain and palpitations.  Gastrointestinal: Negative for nausea and vomiting.  Endocrine: Negative for cold intolerance and heat intolerance.      Objective:    BP 118/72 (BP Location: Left Arm, Patient Position: Sitting, Cuff Size: Normal)   Pulse 72   Wt 154 lb (69.9 kg)   SpO2 97%   BMI 28.17 kg/m  BP Readings from Last 3 Encounters:  07/08/18 118/72  06/16/17 122/68  12/12/16 130/70   Wt Readings from Last 3 Encounters:  07/08/18 154 lb (69.9 kg)  06/16/17 148 lb 6.4 oz (67.3 kg)  12/12/16 149 lb 12.8 oz (67.9 kg)  Physical Exam  Constitutional: She appears well-developed and well-nourished.  Eyes: Conjunctivae are normal.  Cardiovascular: Normal rate, regular rhythm, normal heart sounds and normal pulses.  Pulmonary/Chest: Effort normal and breath sounds normal. She has no wheezes. She has no rhonchi. She has no rales.  Neurological: She is alert.  Skin: Skin is warm and dry.  Psychiatric: She has a normal mood and affect. Her speech is normal and behavior is normal. Thought content normal.  Vitals reviewed.      Assessment & Plan:   Problem List Items Addressed This Visit      Endocrine   Hypothyroidism - Primary    Appears stable. Pending tsh      Relevant Medications   levothyroxine (SYNTHROID, LEVOTHROID) 125 MCG tablet   Other Relevant Orders   CBC with Differential/Platelet   Comprehensive metabolic  panel   Hemoglobin A1c   Lipid panel   TSH   VITAMIN D 25 Hydroxy (Vit-D Deficiency, Fractures)     Musculoskeletal and Integument   Hives   Relevant Medications   hydrOXYzine (ATARAX/VISTARIL) 50 MG tablet     Other   Raynaud phenomenon    Stable. Refilled CCB.       Relevant Medications   NIFEdipine (PROCARDIA-XL/NIFEDICAL-XL) 30 MG 24 hr tablet   Seasonal allergies    Stable on regimen. Discussed being on dual antihistamine regimen. Patient would like to continue regimen as too overly sedating for her. Advised NO more antihistamines, or increase of current regimen without allergy consult. She verbalized understanding .       Relevant Medications   fexofenadine (ALLEGRA ALLERGY) 180 MG tablet    Other Visit Diagnoses    Need for vaccination with 13-polyvalent pneumococcal conjugate vaccine       Relevant Orders   Pneumococcal conjugate vaccine 13-valent IM (Completed)       I have discontinued Carnelia Dutch's fexofenadine and hydrOXYzine. I have changed her ALLEGRA ALLERGY to fexofenadine. I have also changed her NIFEdipine and levothyroxine. Additionally, I am having her start on hydrOXYzine. Lastly, I am having her maintain her Desonide Lot-Moisturizing Crea, Fenoprofen Calcium, Fish Oil, Misc Natural Products (GLUCOSAMINE CHONDROITIN MSM PO), Coenzyme Q10, Vitamin D3, Melatonin, multivitamin, triamcinolone cream, mometasone, desonide, pseudoephedrine, and omeprazole.   Meds ordered this encounter  Medications  . hydrOXYzine (ATARAX/VISTARIL) 50 MG tablet    Sig: Take 2 tablets (100 mg total) by mouth at bedtime.    Dispense:  180 tablet    Refill:  3    Order Specific Question:   Supervising Provider    Answer:   Deborra Medina L [[id number]]  . NIFEdipine (PROCARDIA-XL/NIFEDICAL-XL) 30 MG 24 hr tablet    Sig: Take 1 tablet (30 mg total) by mouth daily.    Dispense:  90 tablet    Refill:  3    Order Specific Question:   Supervising Provider    Answer:   Deborra Medina L  [[id number]]  . levothyroxine (SYNTHROID, LEVOTHROID) 125 MCG tablet    Sig: Take 1 tablet (125 mcg total) by mouth daily.    Dispense:  90 tablet    Refill:  3    Due for follow up    Order Specific Question:   Supervising Provider    Answer:   Deborra Medina L [[id number]]  . fexofenadine (ALLEGRA ALLERGY) 180 MG tablet    Sig: Take 1 tablet (180 mg total) by mouth daily.    Dispense:  90 tablet    Refill:  3  Order Specific Question:   Supervising Provider    Answer:   Crecencio Mc [[id number]]    Return precautions given.   Risks, benefits, and alternatives of the medications and treatment plan prescribed today were discussed, and patient expressed understanding.   Education regarding symptom management and diagnosis given to patient on AVS.  Continue to follow with Burnard Hawthorne, FNP for routine health maintenance.   Clayborne Dana and I agreed with plan.   Mable Paris, FNP

## 2018-07-15 ENCOUNTER — Other Ambulatory Visit: Payer: Self-pay | Admitting: Family

## 2018-07-31 ENCOUNTER — Ambulatory Visit
Admission: RE | Admit: 2018-07-31 | Discharge: 2018-07-31 | Disposition: A | Discharge status: Home / Self Care | Payer: Medicare HMO | Source: Ambulatory Visit | Attending: Family | Admitting: Family

## 2018-07-31 DIAGNOSIS — Z1231 Encounter for screening mammogram for malignant neoplasm of breast: Secondary | ICD-10-CM | POA: Diagnosis not present

## 2018-09-03 ENCOUNTER — Other Ambulatory Visit: Payer: Self-pay | Admitting: Family

## 2018-09-03 ENCOUNTER — Telehealth: Payer: Self-pay

## 2018-09-03 DIAGNOSIS — J302 Other seasonal allergic rhinitis: Secondary | ICD-10-CM

## 2018-09-03 MED ORDER — MOMETASONE FUROATE 50 MCG/ACT NA SUSP: 2.0000 | Freq: Every day | NASAL | 3 refills | Status: DC

## 2018-09-03 NOTE — Telephone Encounter (Unsigned)
Copied from CRM [id number]. Topic: Quick Communication - Rx Refill/Question >> Sep 03, 2018 11:13 AM Gaynelle Adu wrote: Medication: mometasone (NASONEX) 50 MCG/ACT nasal spray  Has the patient contacted their pharmacy? No   Preferred Pharmacy (with phone number or street name): [id number] [place], [place] - [address] [phone] (Phone) [phone] (Fax)    Agent: Please be advised that RX refills may take up to 3 business days. We ask that you follow-up with your pharmacy.

## 2018-09-03 NOTE — Telephone Encounter (Signed)
Copied from CRM [id number]. Topic: Quick Communication - Rx Refill/Question >> Sep 03, 2018 11:13 AM Gaynelle Adu wrote: Medication: mometasone (NASONEX) 50 MCG/ACT nasal spray  Has the patient contacted their pharmacy? No   Preferred Pharmacy (with phone number or street name): [id number] [place], [place] - [address] [phone] (Phone) [phone] (Fax)    Agent: Please be advised that RX refills may take up to 3 business days. We ask that you follow-up with your pharmacy.   I have sent.

## 2018-09-15 DIAGNOSIS — R69 Illness, unspecified: Secondary | ICD-10-CM | POA: Diagnosis not present

## 2018-09-30 DIAGNOSIS — R69 Illness, unspecified: Secondary | ICD-10-CM | POA: Diagnosis not present

## 2018-11-09 ENCOUNTER — Encounter: Payer: Medicare HMO | Admitting: Family

## 2018-12-23 ENCOUNTER — Telehealth: Payer: Self-pay | Admitting: Family

## 2018-12-23 NOTE — Telephone Encounter (Signed)
Copied from CRM [id number]. Topic: Quick Communication - Rx Refill/Question >> Dec 23, 2018  2:30 PM Baldo Daub L wrote: Medication: omeprazole (PRILOSEC) 20 MG capsule  Has the patient contacted their pharmacy? Yes - no refills at pharmacy for her.  Pt needs a 90 day supply (Agent: If no, request that the patient contact the pharmacy for the refill.) (Agent: If yes, when and what did the pharmacy advise?)  Preferred Pharmacy (with phone number or street name): [id number] [place], [place] - [address] [phone] (Phone) [phone] (Fax)  Agent: Please be advised that RX refills may take up to 3 business days. We ask that you follow-up with your pharmacy.

## 2018-12-25 ENCOUNTER — Other Ambulatory Visit: Payer: Self-pay

## 2018-12-25 MED ORDER — OMEPRAZOLE 20 MG PO CPDR: 20.0000 mg | DELAYED_RELEASE_CAPSULE | Freq: Every day | ORAL | 0 refills | Status: DC

## 2018-12-25 NOTE — Telephone Encounter (Signed)
I have sent in 90 day supply for patient to pharmacy.

## 2019-03-02 ENCOUNTER — Other Ambulatory Visit: Payer: Self-pay | Admitting: Family

## 2019-04-06 ENCOUNTER — Other Ambulatory Visit: Payer: Self-pay | Admitting: Family

## 2019-04-09 ENCOUNTER — Ambulatory Visit (INDEPENDENT_AMBULATORY_CARE_PROVIDER_SITE_OTHER): Payer: Medicare HMO | Admitting: Family

## 2019-04-09 ENCOUNTER — Encounter: Payer: Self-pay | Admitting: Family

## 2019-04-09 DIAGNOSIS — K219 Gastro-esophageal reflux disease without esophagitis: Secondary | ICD-10-CM

## 2019-04-09 DIAGNOSIS — E039 Hypothyroidism, unspecified: Secondary | ICD-10-CM

## 2019-04-09 DIAGNOSIS — I7300 Raynaud's syndrome without gangrene: Secondary | ICD-10-CM

## 2019-04-09 DIAGNOSIS — Z1239 Encounter for other screening for malignant neoplasm of breast: Secondary | ICD-10-CM | POA: Diagnosis not present

## 2019-04-09 MED ORDER — NIFEDIPINE ER OSMOTIC RELEASE 30 MG PO TB24: 30.0000 mg | ORAL_TABLET | Freq: Every day | ORAL | 3 refills | Status: DC

## 2019-04-09 MED ORDER — OMEPRAZOLE 20 MG PO CPDR: DELAYED_RELEASE_CAPSULE | ORAL | 4 refills | Status: DC

## 2019-04-09 MED ORDER — LEVOTHYROXINE SODIUM 125 MCG PO TABS: 125.0000 ug | ORAL_TABLET | Freq: Every day | ORAL | 3 refills | Status: DC

## 2019-04-09 NOTE — Patient Instructions (Addendum)
Sorry we lost you at end of visit. We were discussing labs, health maintenance.   We will call you to schedule fasting labs . You will need a follow up appointment with me very end of the year or early next year.   Please call call and schedule your 3D mammogram, bone density scan as discussed this December.    Dalton  Curran, [place]    Stay safe!

## 2019-04-09 NOTE — Assessment & Plan Note (Signed)
Pending TSH 

## 2019-04-09 NOTE — Progress Notes (Signed)
This visit type was conducted due to national recommendations for restrictions regarding the COVID-19 pandemic (e.g. social distancing).  This format is felt to be most appropriate for this patient at this time.  All issues noted in this document were discussed and addressed.  No physical exam was performed (except for noted visual exam findings with Video Visits). Virtual Visit via Video Note  I connected with@  on 04/09/19 at  3:00 PM EDT by a video enabled telemedicine application and verified that I am speaking with the correct person using two identifiers.  Location patient: home Location provider:work Persons participating in the virtual visit: patient, provider  I discussed the limitations of evaluation and management by telemedicine and the availability of in person appointments. The patient expressed understanding and agreed to proceed.  Interactive audio and video telecommunications were attempted between this provider and patient. I could see patient at first for exam however lost connection with patient when wrapping up visit and advising to schedule mammogram/ bone density. We will call her to schedule this along with labs.      HPI:  Doing well today. No concerns.   Raynauds- doing well on procardia. Would like refill GERD- doing well on prilosec. Would like refill as well.   Due mammogram/dexa,  screening labs, tdap.    ROS: See pertinent positives and negatives per HPI.  Past Medical History:  Diagnosis Date  . Thyroid disease     Past Surgical History:  Procedure Laterality Date  . APPENDECTOMY      Family History  Problem Relation Age of Onset  . Cancer Mother        Breast  . Hypertension Mother   . Diabetes Mother   . Breast cancer Mother 70  . Stroke Father   . Hypertension Father   . Breast cancer Paternal Grandmother 18    SOCIAL HX: never smoker   Current Outpatient Medications:  .  Cholecalciferol (VITAMIN D3) 2000 units TABS, Take by  mouth., Disp: , Rfl:  .  Coenzyme Q10 (CVS COQ-10) 200 MG capsule, Take 200 mg by mouth daily., Disp: , Rfl:  .  desonide (DESOWEN) 0.05 % lotion, Apply 2 (two) times daily topically., Disp: 59 mL, Rfl: 0 .  Desonide Lot-Moisturizing Crea 0.05 % KIT, Apply topically., Disp: , Rfl:  .  Fenoprofen Calcium (NALFON) 200 MG CAPS capsule, Take 200 mg by mouth., Disp: , Rfl:  .  fexofenadine (ALLEGRA ALLERGY) 180 MG tablet, Take 1 tablet (180 mg total) by mouth daily., Disp: 90 tablet, Rfl: 3 .  hydrOXYzine (ATARAX/VISTARIL) 50 MG tablet, Take 2 tablets (100 mg total) by mouth at bedtime., Disp: 180 tablet, Rfl: 3 .  levothyroxine (SYNTHROID) 125 MCG tablet, Take 1 tablet (125 mcg total) by mouth daily., Disp: 90 tablet, Rfl: 3 .  Melatonin 5 MG TABS, Take by mouth., Disp: , Rfl:  .  Misc Natural Products (GLUCOSAMINE CHONDROITIN MSM PO), Take 1,500 mg by mouth., Disp: , Rfl:  .  mometasone (NASONEX) 50 MCG/ACT nasal spray, Place 2 sprays into the nose daily., Disp: 51 g, Rfl: 3 .  Multiple Vitamin (MULTIVITAMIN) tablet, Take 1 tablet by mouth daily., Disp: , Rfl:  .  NIFEdipine (PROCARDIA-XL/NIFEDICAL-XL) 30 MG 24 hr tablet, Take 1 tablet (30 mg total) by mouth daily., Disp: 90 tablet, Rfl: 3 .  Omega-3 Fatty Acids (FISH OIL) 1000 MG CAPS, Take by mouth., Disp: , Rfl:  .  omeprazole (PRILOSEC) 20 MG capsule, TAKE 1 CAPSULE BY MOUTH EVERY DAY,  Disp: 90 capsule, Rfl: 4 .  pseudoephedrine (SUDAFED) 120 MG 12 hr tablet, Take 1 tablet (120 mg total) 2 (two) times daily by mouth., Disp: 180 tablet, Rfl: 1 .  triamcinolone cream (KENALOG) 0.1 %, Apply 1 application 2 (two) times daily topically., Disp: 30 g, Rfl: 1  EXAM:  VITALS per patient if applicable:  GENERAL: alert, oriented, appears well and in no acute distress  HEENT: atraumatic, conjunttiva clear, no obvious abnormalities on inspection of external nose and ears  NECK: normal movements of the head and neck  LUNGS: on inspection no signs of  respiratory distress, breathing rate appears normal, no obvious gross SOB, gasping or wheezing  CV: no obvious cyanosis  MS: moves all visible extremities without noticeable abnormality  PSYCH/NEURO: pleasant and cooperative, no obvious depression or anxiety, speech and thought processing grossly intact  ASSESSMENT AND PLAN:  Discussed the following assessment and plan:  Problem List Items Addressed This Visit      Digestive   GERD (gastroesophageal reflux disease) - Primary    Controlled, continue regimen      Relevant Medications   omeprazole (PRILOSEC) 20 MG capsule     Endocrine   Hypothyroidism    Pending TSH      Relevant Medications   levothyroxine (SYNTHROID) 125 MCG tablet   Other Relevant Orders   CBC with Differential/Platelet   Comprehensive metabolic panel   Hemoglobin A1c   Lipid panel   TSH   VITAMIN D 25 Hydroxy (Vit-D Deficiency, Fractures)   Hepatitis C antibody     Other   Raynaud phenomenon    Continue regimen.      Relevant Medications   NIFEdipine (PROCARDIA-XL/NIFEDICAL-XL) 30 MG 24 hr tablet   Screening for breast cancer   Relevant Orders   MM 3D SCREEN BREAST BILATERAL   DG Bone Density        Of note, we have advised patient to schedule bone density and mammogram  I discussed the assessment and treatment plan with the patient. The patient was provided an opportunity to ask questions and all were answered. The patient agreed with the plan and demonstrated an understanding of the instructions.   The patient was advised to call back or seek an in-person evaluation if the symptoms worsen or if the condition fails to improve as anticipated.   Mable Paris, FNP

## 2019-04-09 NOTE — Assessment & Plan Note (Signed)
Continue regimen 

## 2019-04-09 NOTE — Assessment & Plan Note (Signed)
Controlled, continue regimen.

## 2019-04-19 ENCOUNTER — Telehealth: Payer: Self-pay

## 2019-04-19 NOTE — Telephone Encounter (Signed)
LMTCB to scheduled fasting labs, ensure she schedules mammo & DEXA with [facility]. Patient also due for pneumovax 23 in late Dec/Jan.

## 2019-04-28 DIAGNOSIS — R69 Illness, unspecified: Secondary | ICD-10-CM | POA: Diagnosis not present

## 2019-05-06 ENCOUNTER — Encounter: Payer: Self-pay | Admitting: Family

## 2019-05-18 ENCOUNTER — Other Ambulatory Visit (INDEPENDENT_AMBULATORY_CARE_PROVIDER_SITE_OTHER): Payer: Medicare HMO

## 2019-05-18 ENCOUNTER — Other Ambulatory Visit: Payer: Self-pay

## 2019-05-18 DIAGNOSIS — E039 Hypothyroidism, unspecified: Secondary | ICD-10-CM | POA: Diagnosis not present

## 2019-05-18 LAB — CBC WITH DIFFERENTIAL/PLATELET
Basophils Absolute: 0.1 10*3/uL (ref 0.0–0.1)
Basophils Relative: 1.4 % (ref 0.0–3.0)
Eosinophils Absolute: 0.1 10*3/uL (ref 0.0–0.7)
Eosinophils Relative: 3.1 % (ref 0.0–5.0)
HCT: 41.1 % (ref 36.0–46.0)
Hemoglobin: 13.7 g/dL (ref 12.0–15.0)
Lymphocytes Relative: 33.4 % (ref 12.0–46.0)
Lymphs Abs: 1.6 10*3/uL (ref 0.7–4.0)
MCHC: 33.3 g/dL (ref 30.0–36.0)
MCV: 97.1 fl (ref 78.0–100.0)
Monocytes Absolute: 0.3 10*3/uL (ref 0.1–1.0)
Monocytes Relative: 6.6 % (ref 3.0–12.0)
Neutro Abs: 2.7 10*3/uL (ref 1.4–7.7)
Neutrophils Relative %: 55.5 % (ref 43.0–77.0)
Platelets: 281 10*3/uL (ref 150.0–400.0)
RBC: 4.23 Mil/uL (ref 3.87–5.11)
RDW: 13.4 % (ref 11.5–15.5)
WBC: 4.8 10*3/uL (ref 4.0–10.5)

## 2019-05-18 LAB — LIPID PANEL
Cholesterol: 209 mg/dL — ABNORMAL HIGH (ref 0–200)
HDL: 84.5 mg/dL (ref >39.00)
LDL Cholesterol: 111 mg/dL — ABNORMAL HIGH (ref 0–99)
NonHDL: 124.19
Total CHOL/HDL Ratio: 2
Triglycerides: 65 mg/dL (ref 0.0–149.0)
VLDL: 13 mg/dL (ref 0.0–40.0)

## 2019-05-18 LAB — COMPREHENSIVE METABOLIC PANEL
ALT: 18 U/L (ref 0–35)
AST: 22 U/L (ref 0–37)
Albumin: 4.5 g/dL (ref 3.5–5.2)
Alkaline Phosphatase: 85 U/L (ref 39–117)
BUN: 16 mg/dL (ref 6–23)
CO2: 29 mEq/L (ref 19–32)
Calcium: 9.8 mg/dL (ref 8.4–10.5)
Chloride: 102 mEq/L (ref 96–112)
Creatinine, Ser: 0.7 mg/dL (ref 0.40–1.20)
GFR: 83.69 mL/min (ref >60.00)
Glucose, Bld: 101 mg/dL — ABNORMAL HIGH (ref 70–99)
Potassium: 4.4 mEq/L (ref 3.5–5.1)
Sodium: 140 mEq/L (ref 135–145)
Total Bilirubin: 0.7 mg/dL (ref 0.2–1.2)
Total Protein: 6.8 g/dL (ref 6.0–8.3)

## 2019-05-18 LAB — TSH: TSH: 3.65 u[IU]/mL (ref 0.35–4.50)

## 2019-05-18 LAB — VITAMIN D 25 HYDROXY (VIT D DEFICIENCY, FRACTURES): VITD: 50.79 ng/mL (ref 30.00–100.00)

## 2019-05-18 LAB — HEMOGLOBIN A1C: Hgb A1c MFr Bld: 5.8 % (ref 4.6–6.5)

## 2019-05-19 LAB — HEPATITIS C ANTIBODY
Hepatitis C Ab: NONREACTIVE
SIGNAL TO CUT-OFF: 0.02 (ref <1.00)

## 2019-06-30 IMAGING — MG DIGITAL SCREENING BILATERAL MAMMOGRAM WITH TOMO AND CAD
8 series · 9 of 24 positions shown · non-contrast
Comparison: Previous exam(s).

CLINICAL DATA: Screening.

EXAM:
DIGITAL SCREENING BILATERAL MAMMOGRAM WITH TOMO AND CAD

[L MLO synth-2D]
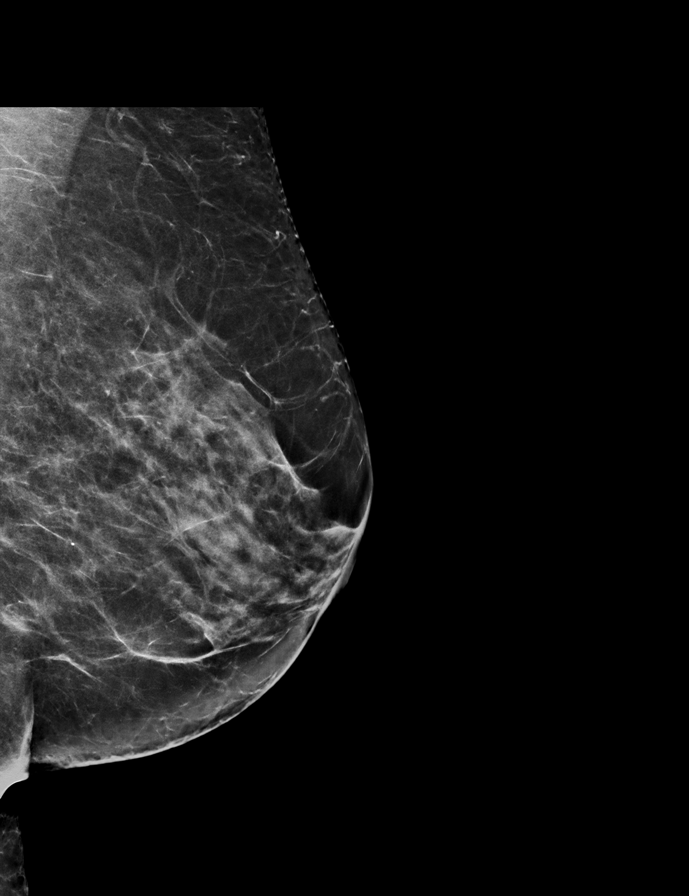

[R CC synth-2D]
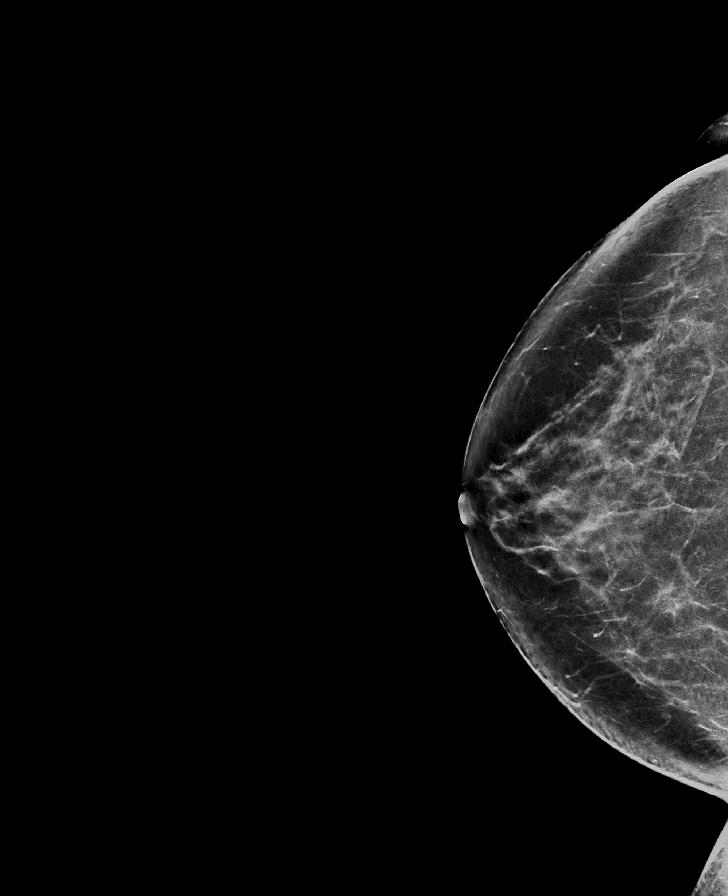

[R MLO synth-2D]
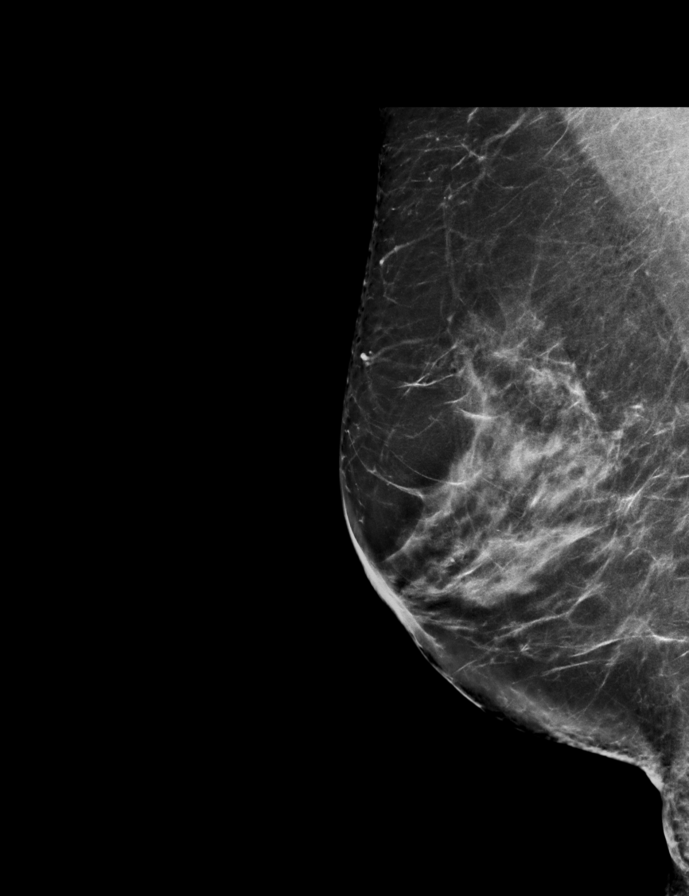

[L CC synth-2D]
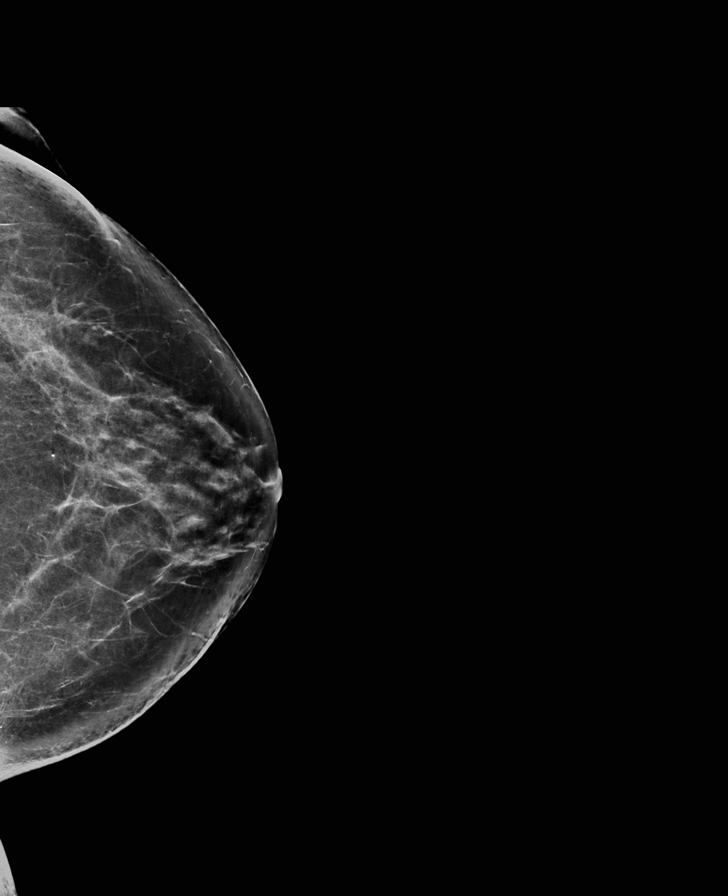

[L MLO tomo · 2 of 76 frames shown]
[frame 25/76]
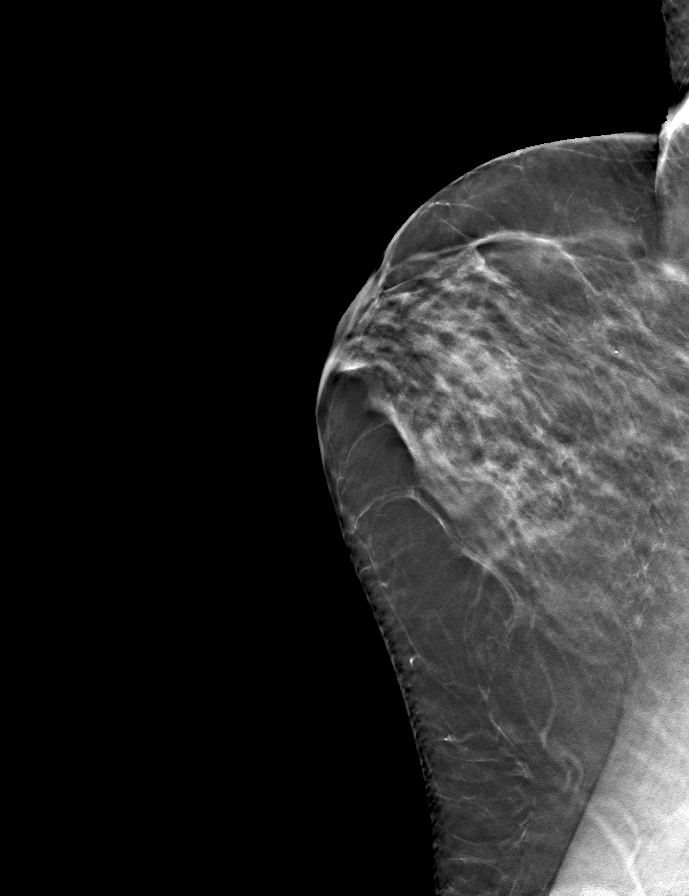
[frame 39/76]
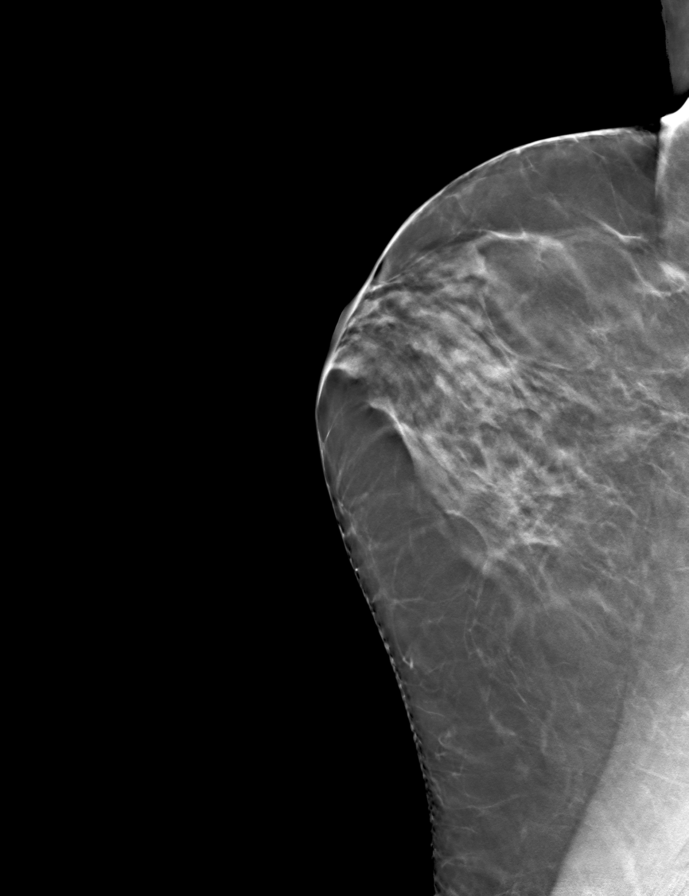

[L CC tomo · tomo slice 43/85.0]
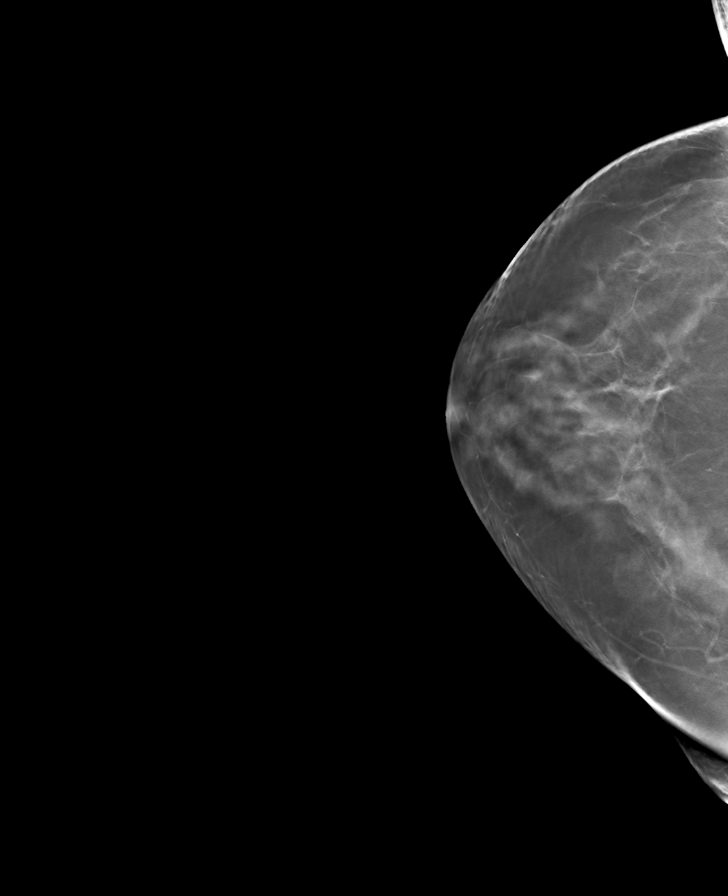

[R MLO tomo · tomo slice 42/83.0]
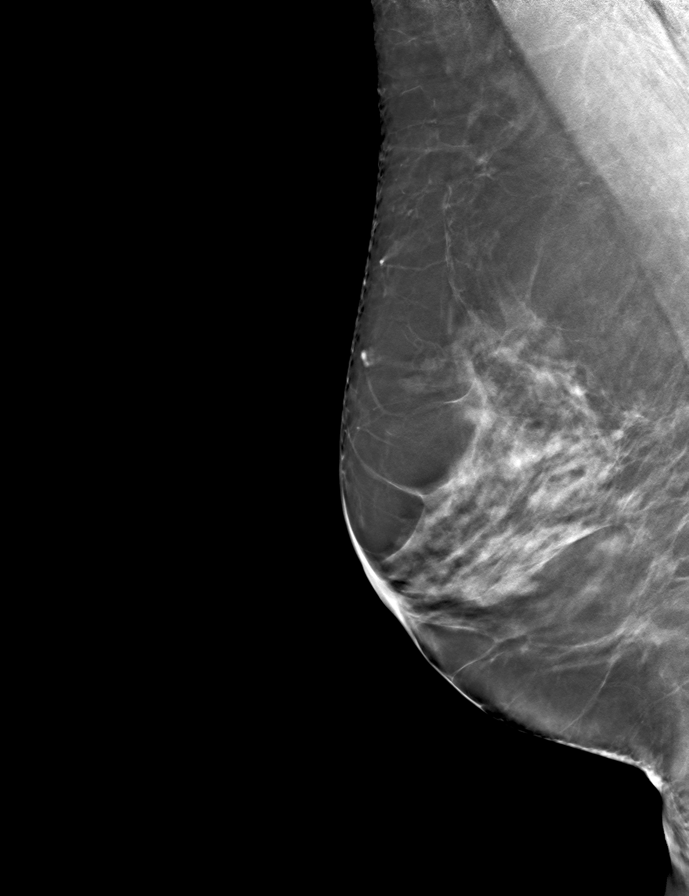

[R CC tomo · tomo slice 43/85.0]
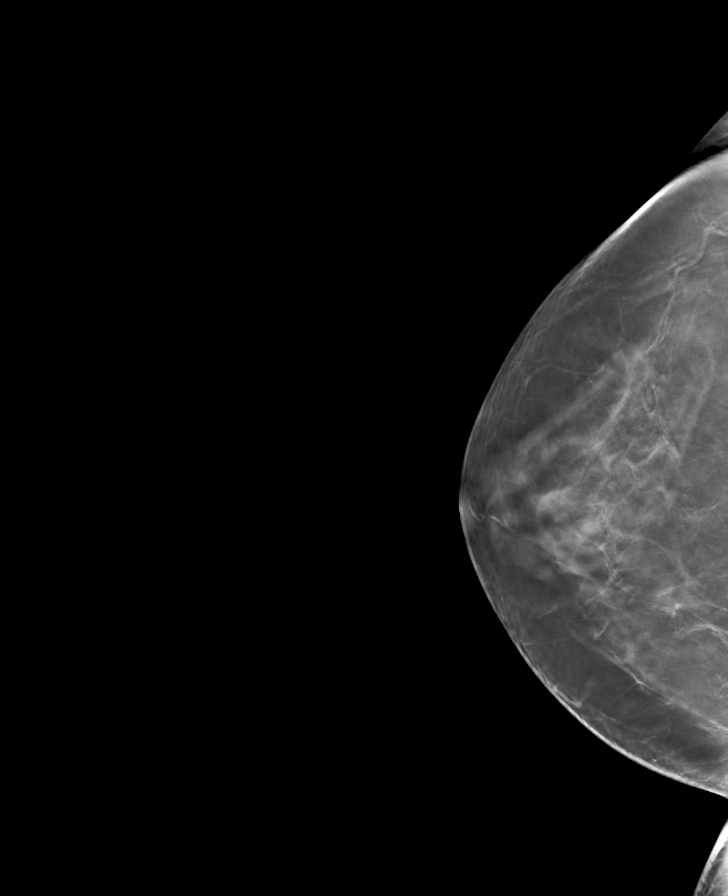

[9 of 24 positions shown; findings below may reference images not displayed]

ACR Breast Density Category b: There are scattered areas of
fibroglandular density.
FINDINGS: There are no findings suspicious for malignancy. Images were
processed with CAD.
IMPRESSION: No mammographic evidence of malignancy. A result letter of this
screening mammogram will be mailed directly to the patient.

RECOMMENDATION:
Screening mammogram in one year. (Code:[id number])

BI-RADS CATEGORY  1: Negative.

## 2019-07-23 ENCOUNTER — Other Ambulatory Visit: Payer: Self-pay | Admitting: Family

## 2019-07-23 DIAGNOSIS — L509 Urticaria, unspecified: Secondary | ICD-10-CM

## 2019-07-24 ENCOUNTER — Other Ambulatory Visit: Payer: Self-pay | Admitting: Family

## 2019-07-24 DIAGNOSIS — J302 Other seasonal allergic rhinitis: Secondary | ICD-10-CM

## 2019-07-24 DIAGNOSIS — I7300 Raynaud's syndrome without gangrene: Secondary | ICD-10-CM

## 2019-08-09 ENCOUNTER — Other Ambulatory Visit: Payer: Self-pay | Admitting: Family

## 2019-08-09 DIAGNOSIS — J302 Other seasonal allergic rhinitis: Secondary | ICD-10-CM

## 2019-08-12 ENCOUNTER — Ambulatory Visit
Admission: RE | Admit: 2019-08-12 | Discharge: 2019-08-12 | Disposition: A | Discharge status: Home / Self Care | Payer: Medicare HMO | Source: Ambulatory Visit | Attending: Family | Admitting: Family

## 2019-08-12 DIAGNOSIS — Z1239 Encounter for other screening for malignant neoplasm of breast: Secondary | ICD-10-CM

## 2019-08-12 DIAGNOSIS — M8589 Other specified disorders of bone density and structure, multiple sites: Secondary | ICD-10-CM | POA: Diagnosis not present

## 2019-08-12 DIAGNOSIS — Z780 Asymptomatic menopausal state: Secondary | ICD-10-CM | POA: Diagnosis not present

## 2019-08-12 DIAGNOSIS — Z1231 Encounter for screening mammogram for malignant neoplasm of breast: Secondary | ICD-10-CM | POA: Insufficient documentation

## 2019-08-16 ENCOUNTER — Encounter: Payer: Self-pay | Admitting: Family

## 2019-08-16 DIAGNOSIS — M8580 Other specified disorders of bone density and structure, unspecified site: Secondary | ICD-10-CM | POA: Insufficient documentation

## 2019-09-17 DIAGNOSIS — H2513 Age-related nuclear cataract, bilateral: Secondary | ICD-10-CM | POA: Diagnosis not present

## 2019-09-18 ENCOUNTER — Ambulatory Visit: Payer: Medicare HMO | Attending: Internal Medicine

## 2019-09-18 DIAGNOSIS — Z23 Encounter for immunization: Secondary | ICD-10-CM | POA: Insufficient documentation

## 2019-09-18 NOTE — Progress Notes (Signed)
   Covid-19 Vaccination Clinic  Name:  Alexis Mcintosh    MRN: [id number] DOB: Aug 24, 1950  09/18/2019  Ms. Alexis Mcintosh was observed post Covid-19 immunization for 30 minutes based on pre-vaccination screening without incidence. She was provided with Vaccine Information Sheet and instruction to access the V-Safe system.   Ms. Alexis Mcintosh was instructed to call 911 with any severe reactions post vaccine: Marland Kitchen Difficulty breathing  . Swelling of your face and throat  . A fast heartbeat  . A bad rash all over your body  . Dizziness and weakness    Immunizations Administered    Name Date Dose VIS Date Route   Pfizer COVID-19 Vaccine 09/18/2019 11:18 AM 0.3 mL 07/16/2019 Intramuscular   Manufacturer: [employer], [employer]   Lot: [id number]   NDC: [address]-7

## 2019-10-09 ENCOUNTER — Ambulatory Visit: Payer: Medicare HMO | Attending: Internal Medicine

## 2019-10-09 DIAGNOSIS — Z23 Encounter for immunization: Secondary | ICD-10-CM | POA: Insufficient documentation

## 2019-10-09 NOTE — Progress Notes (Signed)
   Covid-19 Vaccination Clinic  Name:  Alexis Mcintosh    MRN: [id number] DOB: 09-01-1950  10/09/2019  Alexis Mcintosh was observed post Covid-19 immunization for 15 minutes without incident. She was provided with Vaccine Information Sheet and instruction to access the V-Safe system.   Alexis Mcintosh was instructed to call 911 with any severe reactions post vaccine: Marland Kitchen Difficulty breathing  . Swelling of face and throat  . A fast heartbeat  . A bad rash all over body  . Dizziness and weakness   Immunizations Administered    Name Date Dose VIS Date Route   Pfizer COVID-19 Vaccine 10/09/2019 11:25 AM 0.3 mL 07/16/2019 Intramuscular   Manufacturer: [employer], [employer]   Lot: [id number]   NDC: [address]-5

## 2019-11-11 ENCOUNTER — Other Ambulatory Visit: Payer: Self-pay | Admitting: Family

## 2019-11-11 DIAGNOSIS — J302 Other seasonal allergic rhinitis: Secondary | ICD-10-CM

## 2019-12-02 ENCOUNTER — Other Ambulatory Visit: Payer: Self-pay | Admitting: Family

## 2019-12-02 DIAGNOSIS — L509 Urticaria, unspecified: Secondary | ICD-10-CM

## 2019-12-04 ENCOUNTER — Other Ambulatory Visit: Payer: Self-pay | Admitting: Family

## 2019-12-04 DIAGNOSIS — J302 Other seasonal allergic rhinitis: Secondary | ICD-10-CM

## 2020-01-06 ENCOUNTER — Other Ambulatory Visit: Payer: Self-pay | Admitting: Family

## 2020-01-06 DIAGNOSIS — J302 Other seasonal allergic rhinitis: Secondary | ICD-10-CM

## 2020-01-18 ENCOUNTER — Telehealth: Payer: Self-pay | Admitting: Family

## 2020-01-18 NOTE — Telephone Encounter (Signed)
Left message for patient to call back and schedule Medicare Annual Wellness Visit (AWV) either virtually or audio only.  No hx of AWV; please schedule at anytime with Denisa O'Brien-Blaney at [facility]   

## 2020-02-01 ENCOUNTER — Other Ambulatory Visit: Payer: Self-pay | Admitting: Family

## 2020-02-01 DIAGNOSIS — J302 Other seasonal allergic rhinitis: Secondary | ICD-10-CM

## 2020-02-15 ENCOUNTER — Other Ambulatory Visit: Payer: Self-pay | Admitting: Family

## 2020-02-15 DIAGNOSIS — J302 Other seasonal allergic rhinitis: Secondary | ICD-10-CM

## 2020-04-14 ENCOUNTER — Other Ambulatory Visit: Payer: Self-pay | Admitting: Family

## 2020-04-14 DIAGNOSIS — J302 Other seasonal allergic rhinitis: Secondary | ICD-10-CM

## 2020-04-27 DIAGNOSIS — R69 Illness, unspecified: Secondary | ICD-10-CM | POA: Diagnosis not present

## 2020-05-11 ENCOUNTER — Other Ambulatory Visit: Payer: Self-pay | Admitting: Family

## 2020-05-11 DIAGNOSIS — J302 Other seasonal allergic rhinitis: Secondary | ICD-10-CM

## 2020-05-18 ENCOUNTER — Telehealth: Payer: Self-pay

## 2020-05-18 NOTE — Telephone Encounter (Signed)
Left a message to call back and schedule an appointment with PCP as Alexis Mcintosh has not been seen in a year.

## 2020-05-19 NOTE — Telephone Encounter (Signed)
noted 

## 2020-06-01 ENCOUNTER — Other Ambulatory Visit: Payer: Self-pay | Admitting: Family

## 2020-06-01 DIAGNOSIS — E039 Hypothyroidism, unspecified: Secondary | ICD-10-CM

## 2020-07-03 ENCOUNTER — Other Ambulatory Visit: Payer: Self-pay | Admitting: Family

## 2020-07-03 DIAGNOSIS — K219 Gastro-esophageal reflux disease without esophagitis: Secondary | ICD-10-CM

## 2020-07-03 DIAGNOSIS — J302 Other seasonal allergic rhinitis: Secondary | ICD-10-CM

## 2020-08-03 DIAGNOSIS — Z20822 Contact with and (suspected) exposure to covid-19: Secondary | ICD-10-CM | POA: Diagnosis not present

## 2020-08-18 ENCOUNTER — Other Ambulatory Visit: Payer: Self-pay | Admitting: Family

## 2020-08-18 DIAGNOSIS — J302 Other seasonal allergic rhinitis: Secondary | ICD-10-CM

## 2020-09-04 ENCOUNTER — Other Ambulatory Visit: Payer: Self-pay | Admitting: Family

## 2020-09-04 DIAGNOSIS — L509 Urticaria, unspecified: Secondary | ICD-10-CM

## 2020-09-09 ENCOUNTER — Other Ambulatory Visit: Payer: Self-pay | Admitting: Family

## 2020-09-09 DIAGNOSIS — J302 Other seasonal allergic rhinitis: Secondary | ICD-10-CM

## 2020-09-12 ENCOUNTER — Other Ambulatory Visit: Payer: Self-pay | Admitting: Family

## 2020-09-12 DIAGNOSIS — Z1231 Encounter for screening mammogram for malignant neoplasm of breast: Secondary | ICD-10-CM

## 2020-10-03 ENCOUNTER — Ambulatory Visit
Admission: RE | Admit: 2020-10-03 | Discharge: 2020-10-03 | Disposition: A | Discharge status: Home / Self Care | Payer: Medicare HMO | Source: Ambulatory Visit | Attending: Family | Admitting: Family

## 2020-10-03 ENCOUNTER — Other Ambulatory Visit: Payer: Self-pay

## 2020-10-03 DIAGNOSIS — Z1231 Encounter for screening mammogram for malignant neoplasm of breast: Secondary | ICD-10-CM | POA: Diagnosis not present

## 2020-10-04 ENCOUNTER — Other Ambulatory Visit: Payer: Self-pay | Admitting: Family

## 2020-10-04 DIAGNOSIS — J302 Other seasonal allergic rhinitis: Secondary | ICD-10-CM

## 2020-11-14 ENCOUNTER — Other Ambulatory Visit: Payer: Self-pay | Admitting: Family

## 2020-11-14 DIAGNOSIS — I7300 Raynaud's syndrome without gangrene: Secondary | ICD-10-CM

## 2020-11-16 NOTE — Telephone Encounter (Signed)
Call pt We re'ed refill req for nifedipine however pt hasnt been seen in 2 years  I provided 30 day supply Please sch f/u appt

## 2020-11-23 NOTE — Telephone Encounter (Signed)
LMTCB to make an appointment to receive further refills.

## 2020-12-05 ENCOUNTER — Other Ambulatory Visit: Payer: Self-pay

## 2020-12-05 ENCOUNTER — Ambulatory Visit (INDEPENDENT_AMBULATORY_CARE_PROVIDER_SITE_OTHER): Payer: Medicare HMO | Admitting: Adult Health

## 2020-12-05 ENCOUNTER — Encounter: Payer: Self-pay | Admitting: Adult Health

## 2020-12-05 VITALS — BP 140/78 | HR 79 | Temp 97.4°F | Ht 62.01 in | Wt 155.2 lb

## 2020-12-05 DIAGNOSIS — R000 Tachycardia, unspecified: Secondary | ICD-10-CM | POA: Diagnosis not present

## 2020-12-05 DIAGNOSIS — E039 Hypothyroidism, unspecified: Secondary | ICD-10-CM | POA: Diagnosis not present

## 2020-12-05 DIAGNOSIS — R9431 Abnormal electrocardiogram [ECG] [EKG]: Secondary | ICD-10-CM | POA: Diagnosis not present

## 2020-12-05 NOTE — Progress Notes (Signed)
Established Patient Office Visit  Subjective:  Patient ID: Alexis Mcintosh, female    DOB: 1951/07/06  Age: 70 y.o. MRN: [id number]  CC:  Chief Complaint  Patient presents with  . Hypertension    Pt c/o elevated heart rate on apple watch  Pt states it is high before she goes to sleep and regular during the day.    HPI Adaria Hole presents for elevated heart rate on her apple watch. She reports heart rate will be 130's to 80's on heart rate. She has some mild palpitations.   She is not taking pseudoephedrine she reports that is listed on her medication list.   She takes allegra in  the morning ,.  She drinks two cups of caffeine  daily.  Non smoker. Denies any new fatigue.  She is taking her synthroid as directed and last TSH was last year.  She denies feeling bad " I clean and do my normal activity".  Patient  denies any fever, body aches,chills, rash, chest pain, shortness of breath, nausea, vomiting, or diarrhea.  Denies dizziness, lightheadedness, pre syncopal or syncopal episodes.    Past Medical History:  Diagnosis Date  . Thyroid disease     Past Surgical History:  Procedure Laterality Date  . APPENDECTOMY      Family History  Problem Relation Age of Onset  . Cancer Mother        Breast  . Hypertension Mother   . Diabetes Mother   . Breast cancer Mother 48  . Stroke Father   . Hypertension Father   . Breast cancer Paternal Grandmother 91    Social History   Socioeconomic History  . Marital status: Married    Spouse name: Not on file  . Number of children: Not on file  . Years of education: Not on file  . Highest education level: Not on file  Occupational History  . Not on file  Tobacco Use  . Smoking status: Never Smoker  . Smokeless tobacco: Never Used  Substance and Sexual Activity  . Alcohol use: Yes  . Drug use: No  . Sexual activity: Never  Other Topics Concern  . Not on file  Social History Narrative  . Not on file   Social  Determinants of Health   Financial Resource Strain: Not on file  Food Insecurity: Not on file  Transportation Needs: Not on file  Physical Activity: Not on file  Stress: Not on file  Social Connections: Not on file  Intimate Partner Violence: Not on file    Outpatient Medications Prior to Visit  Medication Sig Dispense Refill  . Cholecalciferol (VITAMIN D3) 2000 units TABS Take by mouth.    . Coenzyme Q10 200 MG capsule Take 200 mg by mouth daily.    Marland Kitchen desonide (DESOWEN) 0.05 % lotion Apply 2 (two) times daily topically. 59 mL 0  . Desonide Lot-Moisturizing Crea 0.05 % KIT Apply topically.    . Fenoprofen Calcium (NALFON) 200 MG CAPS capsule Take 200 mg by mouth.    . fexofenadine (ALLEGRA) 180 MG tablet TAKE 1 TABLET BY MOUTH EVERY DAY 90 tablet 3  . hydrOXYzine (ATARAX/VISTARIL) 50 MG tablet TAKE 2 TABLETS (100 MG TOTAL) BY MOUTH AT BEDTIME. 180 tablet 3  . levothyroxine (SYNTHROID) 125 MCG tablet TAKE 1 TABLET BY MOUTH EVERY DAY 90 tablet 3  . Melatonin 5 MG TABS Take by mouth.    . Misc Natural Products (GLUCOSAMINE CHONDROITIN MSM PO) Take 1,500 mg by mouth.    Marland Kitchen  mometasone (NASONEX) 50 MCG/ACT nasal spray PLACE 2 SPRAYS INTO THE NOSE DAILY. 3 each 3  . Multiple Vitamin (MULTIVITAMIN) tablet Take 1 tablet by mouth daily.    Marland Kitchen NIFEdipine (ADALAT CC) 30 MG 24 hr tablet TAKE 1 TABLET BY MOUTH EVERY DAY 30 tablet 0  . Omega-3 Fatty Acids (FISH OIL) 1000 MG CAPS Take by mouth.    Marland Kitchen omeprazole (PRILOSEC) 20 MG capsule TAKE 1 CAPSULE BY MOUTH EVERY DAY 90 capsule 4  . triamcinolone cream (KENALOG) 0.1 % Apply 1 application 2 (two) times daily topically. 30 g 1  . pseudoephedrine (SUDAFED) 120 MG 12 hr tablet Take 1 tablet (120 mg total) 2 (two) times daily by mouth. 180 tablet 1   No facility-administered medications prior to visit.    Allergies  Allergen Reactions  . Amoxicillin Hives    ROS Review of Systems  Constitutional: Negative.   HENT: Positive for postnasal drip  (chronic allergies. ). Negative for congestion.   Respiratory: Negative for apnea, cough, choking, chest tightness, shortness of breath, wheezing and stridor.   Cardiovascular: Positive for palpitations. Negative for chest pain and leg swelling.  Gastrointestinal: Negative.   Genitourinary: Negative.   Musculoskeletal: Negative.   Neurological: Negative.   Psychiatric/Behavioral: Negative.       Objective:    Physical Exam Vitals and nursing note reviewed.  Constitutional:      General: She is not in acute distress.    Appearance: Normal appearance. She is not ill-appearing, toxic-appearing or diaphoretic.     Comments: Patient appers well, not sickly. Speaking in complete sentences. Patient moves on and off of exam table and in room without difficulty. Gait is normal in hall and in room. Patient is oriented to person place time and situation. Patient answers questions appropriately and engages eye contact and verbal dialect with provider.   HENT:     Head: Normocephalic and atraumatic.     Right Ear: External ear normal.     Left Ear: External ear normal.     Nose: Nose normal.     Mouth/Throat:     Mouth: Mucous membranes are moist.  Eyes:     Conjunctiva/sclera: Conjunctivae normal.  Neck:     Vascular: No carotid bruit.  Cardiovascular:     Rate and Rhythm: Normal rate and regular rhythm.     Pulses: Normal pulses.     Heart sounds: Normal heart sounds. No murmur heard. No friction rub. No gallop.   Pulmonary:     Effort: Pulmonary effort is normal. No respiratory distress.     Breath sounds: Normal breath sounds. No stridor. No wheezing, rhonchi or rales.  Chest:     Chest wall: No tenderness.  Abdominal:     Palpations: Abdomen is soft.  Musculoskeletal:        General: Normal range of motion.     Cervical back: Normal range of motion and neck supple. No rigidity.  Lymphadenopathy:     Cervical: No cervical adenopathy.  Skin:    General: Skin is warm.      Findings: No erythema or rash.  Neurological:     General: No focal deficit present.     Mental Status: She is alert and oriented to person, place, and time.     Motor: No weakness.     Gait: Gait normal.  Psychiatric:        Mood and Affect: Mood normal.        Behavior: Behavior normal.  Thought Content: Thought content normal.        Judgment: Judgment normal.     BP 140/78 (BP Location: Left Arm, Patient Position: Sitting)   Pulse 79   Temp (!) 97.4 F (36.3 C)   Ht 5' 2.01" (1.575 m)   Wt 155 lb 3.2 oz (70.4 kg)   SpO2 97%   BMI 28.38 kg/m  Wt Readings from Last 3 Encounters:  12/05/20 155 lb 3.2 oz (70.4 kg)  07/08/18 154 lb (69.9 kg)  06/16/17 148 lb 6.4 oz (67.3 kg)   Vitals with BMI 12/05/2020 07/08/2018 06/16/2017  Height 5' 2.008" - _0   Weight 155 lbs 3 oz 154 lbs 148 lbs 6 oz  BMI 85.88 - 50.27  Systolic 741 [id number] 867  Diastolic 78 72 68  Pulse 79 72 83    Health Maintenance Due  Topic Date Due  . TETANUS/TDAP  Never done  . COVID-19 Vaccine (3 - Booster for Pfizer series) 04/10/2020    There are no preventive care reminders to display for this patient.  Lab Results  Component Value Date   TSH 3.65 05/18/2019   Lab Results  Component Value Date   WBC 4.8 05/18/2019   HGB 13.7 05/18/2019   HCT 41.1 05/18/2019   MCV 97.1 05/18/2019   PLT 281.0 05/18/2019   Lab Results  Component Value Date   NA 140 05/18/2019   K 4.4 05/18/2019   CO2 29 05/18/2019   GLUCOSE 101 (H) 05/18/2019   BUN 16 05/18/2019   CREATININE 0.70 05/18/2019   BILITOT 0.7 05/18/2019   ALKPHOS 85 05/18/2019   AST 22 05/18/2019   ALT 18 05/18/2019   PROT 6.8 05/18/2019   ALBUMIN 4.5 05/18/2019   CALCIUM 9.8 05/18/2019   GFR 83.69 05/18/2019   Lab Results  Component Value Date   CHOL 209 (H) 05/18/2019   Lab Results  Component Value Date   HDL 84.50 05/18/2019   Lab Results  Component Value Date   LDLCALC 111 (H) 05/18/2019   Lab Results  Component Value  Date   TRIG 65.0 05/18/2019   Lab Results  Component Value Date   CHOLHDL 2 05/18/2019   Lab Results  Component Value Date   HGBA1C 5.8 05/18/2019      Assessment & Plan:   Problem List Items Addressed This Visit      Endocrine   Hypothyroidism - Primary   Relevant Orders   Magnesium    Other Visit Diagnoses    Tachycardia       Relevant Orders   TSH   Comprehensive metabolic panel   CBC with Differential/Platelet   EKG 12-Lead (Completed)   Magnesium   Ambulatory referral to Cardiology   Abnormal electrocardiogram (ECG) (EKG)       Relevant Orders   Ambulatory referral to Cardiology      1. Hypothyroidism, unspecified typed  TSH  2. Tachycardia Decrease caffeine. Increase hydration. Keep log.  Medications Discontinued During This Encounter  Medication Reason  . pseudoephedrine (SUDAFED) 120 MG 12 hr tablet Completed Course   - TSH; Future - Comprehensive metabolic panel; Future - CBC with Differential/Platelet; Future - EKG 12-Lead - Magnesium; Future - Ambulatory referral to Cardiology  3. Abnormal electrocardiogram (ECG) (EK heartrate in office is normal today as well as blood pressure is high end normal 140/78 she has no other reported cardiac symptoms.  EKG reviewed shows sinus rhythm with rate of 63. Non specific ST depression- non diagnostic . She has  no comparison EKG. She is advised emergency room if any symptoms occur immediately ER and or call 911 if needed. - Ambulatory referral to Cardiology  Orders Placed This Encounter  Procedures  . TSH    Standing Status:   Future    Standing Expiration Date:   01/05/2021  . Comprehensive metabolic panel    Standing Status:   Future    Standing Expiration Date:   01/05/2021  . CBC with Differential/Platelet    Standing Status:   Future    Standing Expiration Date:   12/05/2021  . Magnesium    Standing Status:   Future    Standing Expiration Date:   12/05/2021  . Ambulatory referral to Cardiology     Referral Priority:   Urgent    Referral Type:   Consultation    Referral Reason:   Specialty Services Required    Requested Specialty:   Cardiology    Number of Visits Requested:   1  . EKG 12-Lead   No orders of the defined types were placed in this encounter.   Follow-up: Return in about 1 week (around 12/12/2020), or if symptoms worsen or fail to improve, for at any time for any worsening symptoms, Go to Emergency room/ urgent care if worse.   Red Flags discussed. The patient was given clear instructions to go to ER or return to medical center if any red flags develop, symptoms do not improve, worsen or new problems develop. They verbalized understanding.   Marcille Buffy, FNP

## 2020-12-05 NOTE — Patient Instructions (Signed)

## 2020-12-07 ENCOUNTER — Other Ambulatory Visit: Payer: Self-pay

## 2020-12-07 ENCOUNTER — Other Ambulatory Visit: Payer: Self-pay | Admitting: Adult Health

## 2020-12-07 ENCOUNTER — Other Ambulatory Visit (INDEPENDENT_AMBULATORY_CARE_PROVIDER_SITE_OTHER): Payer: Medicare HMO

## 2020-12-07 DIAGNOSIS — R000 Tachycardia, unspecified: Secondary | ICD-10-CM

## 2020-12-07 DIAGNOSIS — E039 Hypothyroidism, unspecified: Secondary | ICD-10-CM | POA: Diagnosis not present

## 2020-12-07 LAB — CBC WITH DIFFERENTIAL/PLATELET
Basophils Absolute: 0.1 10*3/uL (ref 0.0–0.1)
Basophils Relative: 1.6 % (ref 0.0–3.0)
Eosinophils Absolute: 0.1 10*3/uL (ref 0.0–0.7)
Eosinophils Relative: 2.1 % (ref 0.0–5.0)
HCT: 41.7 % (ref 36.0–46.0)
Hemoglobin: 14.1 g/dL (ref 12.0–15.0)
Lymphocytes Relative: 38.3 % (ref 12.0–46.0)
Lymphs Abs: 1.8 10*3/uL (ref 0.7–4.0)
MCHC: 33.8 g/dL (ref 30.0–36.0)
MCV: 96.8 fl (ref 78.0–100.0)
Monocytes Absolute: 0.4 10*3/uL (ref 0.1–1.0)
Monocytes Relative: 7.6 % (ref 3.0–12.0)
Neutro Abs: 2.4 10*3/uL (ref 1.4–7.7)
Neutrophils Relative %: 50.4 % (ref 43.0–77.0)
Platelets: 299 10*3/uL (ref 150.0–400.0)
RBC: 4.31 Mil/uL (ref 3.87–5.11)
RDW: 13.5 % (ref 11.5–15.5)
WBC: 4.7 10*3/uL (ref 4.0–10.5)

## 2020-12-07 LAB — COMPREHENSIVE METABOLIC PANEL
ALT: 20 U/L (ref 0–35)
AST: 19 U/L (ref 0–37)
Albumin: 4.3 g/dL (ref 3.5–5.2)
Alkaline Phosphatase: 69 U/L (ref 39–117)
BUN: 16 mg/dL (ref 6–23)
CO2: 30 mEq/L (ref 19–32)
Calcium: 9.8 mg/dL (ref 8.4–10.5)
Chloride: 103 mEq/L (ref 96–112)
Creatinine, Ser: 0.89 mg/dL (ref 0.40–1.20)
GFR: 66.96 mL/min (ref >60.00)
Glucose, Bld: 108 mg/dL — ABNORMAL HIGH (ref 70–99)
Potassium: 4.2 mEq/L (ref 3.5–5.1)
Sodium: 141 mEq/L (ref 135–145)
Total Bilirubin: 0.6 mg/dL (ref 0.2–1.2)
Total Protein: 7.2 g/dL (ref 6.0–8.3)

## 2020-12-07 LAB — TSH: TSH: 16.47 u[IU]/mL — ABNORMAL HIGH (ref 0.35–4.50)

## 2020-12-07 LAB — MAGNESIUM: Magnesium: 2.1 mg/dL (ref 1.5–2.5)

## 2020-12-07 MED ORDER — LEVOTHYROXINE SODIUM 150 MCG PO TABS: 150.0000 ug | ORAL_TABLET | Freq: Every day | ORAL | 0 refills | Status: DC

## 2020-12-07 NOTE — Progress Notes (Signed)
Verify with patient that she has been taking her current dosage of synthroid 125 mcg once daily and not taking with vitamins, dairy products or other medications. If she is taking will need to increase dosage as she is very hypothyroid on these labs : new dose sent in and needs recheck TSH in 4 to 6 weeks with a follow up with PCP one week after labs.  Meds ordered this encounter Medications  levothyroxine (SYNTHROID) 150 MCG tablet   Sig: Take 1 tablet (150 mcg total) by mouth daily.   Dispense:  90 tablet   Refill:  0  Magnesium, CBC within normal limits.  CMP with mildly elevated glucose - she may not have been fasting ? Monitor diet and increased exercise advised. Last A1c one year ago was normal.

## 2020-12-07 NOTE — Progress Notes (Signed)
Meds ordered this encounter  Medications  . levothyroxine (SYNTHROID) 150 MCG tablet    Sig: Take 1 tablet (150 mcg total) by mouth daily.    Dispense:  90 tablet    Refill:  0    Medications Discontinued During This Encounter  Medication Reason  . levothyroxine (SYNTHROID) 125 MCG tablet Completed Course    Orders Placed This Encounter  Procedures  . TSH

## 2020-12-08 ENCOUNTER — Other Ambulatory Visit: Payer: Self-pay | Admitting: Family

## 2020-12-08 DIAGNOSIS — I7300 Raynaud's syndrome without gangrene: Secondary | ICD-10-CM

## 2020-12-18 NOTE — Progress Notes (Signed)
Acute Office Visit  Subjective:    Patient ID: Alexis Mcintosh, female    DOB: Nov 06, 1950, 70 y.o.   MRN: [id number]  Chief Complaint  Patient presents with  . Follow-up    Patient has no concerns for todays visit Patient states symptoms are getting better since dosage has been changed    HPI Patient is in today for  Follow up on elevated heart rate, she was advised to discontinue Sudafed, decrease caffiene.  EKG  was non specific she was reffered to cardiology. She prefers not to go to cardiology at this point she is not having any symptoms and is aware of risk versus benefits of this.  Message 12/13/2020 1:26 PM Favor Hackler, Kelby Aline, FNP referral -  Note   ----- Message ----- From: Clarisse Gouge Sent: 12/13/2020   1:09 PM EDT To: Doreen Beam, FNP Subject: referral                                        Unable to contact to schedule  Mailed letter  Closing referral         TSH was found to be 16.47 and synthroid was increased to 150 mcg once daily was previously regularly taking 125 mcg po qd. Started 12/04/20 and advised to recheck in 4 - 6 weeks.   She reports she is feeling better. Heart rate on the watch has now been in normal.   Patient  denies any fever, body aches,chills, rash, chest pain, shortness of breath, nausea, vomiting, or diarrhea.  Denies dizziness, lightheadedness, pre syncopal or syncopal episodes.    Past Medical History:  Diagnosis Date  . Thyroid disease     Past Surgical History:  Procedure Laterality Date  . APPENDECTOMY      Family History  Problem Relation Age of Onset  . Cancer Mother        Breast  . Hypertension Mother   . Diabetes Mother   . Breast cancer Mother 58  . Stroke Father   . Hypertension Father   . Breast cancer Paternal Grandmother 65    Social History   Socioeconomic History  . Marital status: Married    Spouse name: Not on file  . Number of children: Not on file  . Years of education: Not on  file  . Highest education level: Not on file  Occupational History  . Not on file  Tobacco Use  . Smoking status: Never Smoker  . Smokeless tobacco: Never Used  Substance and Sexual Activity  . Alcohol use: Yes  . Drug use: No  . Sexual activity: Never  Other Topics Concern  . Not on file  Social History Narrative  . Not on file   Social Determinants of Health   Financial Resource Strain: Not on file  Food Insecurity: Not on file  Transportation Needs: Not on file  Physical Activity: Not on file  Stress: Not on file  Social Connections: Not on file  Intimate Partner Violence: Not on file    Outpatient Medications Prior to Visit  Medication Sig Dispense Refill  . Cholecalciferol (VITAMIN D3) 2000 units TABS Take by mouth.    . Coenzyme Q10 200 MG capsule Take 200 mg by mouth daily.    Marland Kitchen desonide (DESOWEN) 0.05 % lotion Apply 2 (two) times daily topically. 59 mL 0  . Desonide Lot-Moisturizing Crea 0.05 % KIT Apply topically.    Marland Kitchen  Fenoprofen Calcium (NALFON) 200 MG CAPS capsule Take 200 mg by mouth.    . fexofenadine (ALLEGRA) 180 MG tablet TAKE 1 TABLET BY MOUTH EVERY DAY 90 tablet 3  . hydrOXYzine (ATARAX/VISTARIL) 50 MG tablet TAKE 2 TABLETS (100 MG TOTAL) BY MOUTH AT BEDTIME. 180 tablet 3  . levothyroxine (SYNTHROID) 150 MCG tablet Take 1 tablet (150 mcg total) by mouth daily. 90 tablet 0  . Melatonin 5 MG TABS Take by mouth.    . mometasone (NASONEX) 50 MCG/ACT nasal spray PLACE 2 SPRAYS INTO THE NOSE DAILY. 3 each 3  . Multiple Vitamin (MULTIVITAMIN) tablet Take 1 tablet by mouth daily.    Marland Kitchen NIFEdipine (ADALAT CC) 30 MG 24 hr tablet TAKE 1 TABLET BY MOUTH EVERY DAY 30 tablet 0  . Omega-3 Fatty Acids (FISH OIL) 1000 MG CAPS Take by mouth.    Marland Kitchen omeprazole (PRILOSEC) 20 MG capsule TAKE 1 CAPSULE BY MOUTH EVERY DAY 90 capsule 4  . triamcinolone cream (KENALOG) 0.1 % Apply 1 application 2 (two) times daily topically. 30 g 1  . Misc Natural Products (GLUCOSAMINE CHONDROITIN  MSM PO) Take 1,500 mg by mouth.     No facility-administered medications prior to visit.    Allergies  Allergen Reactions  . Amoxicillin Hives    Review of Systems  Constitutional: Negative.   HENT: Negative.   Respiratory: Negative.   Cardiovascular: Negative.   Gastrointestinal: Negative.   Genitourinary: Negative.   Musculoskeletal: Negative.   Skin: Negative.   Neurological: Negative.   Hematological: Negative.   Psychiatric/Behavioral: Negative.        Objective:    Physical Exam Vitals reviewed.  Constitutional:      General: She is not in acute distress.    Appearance: Normal appearance. She is normal weight. She is not ill-appearing, toxic-appearing or diaphoretic.     Comments: Patient appers well, not sickly. Speaking in complete sentences. Patient moves on and off of exam table and in room without difficulty. Gait is normal in hall and in room. Patient is oriented to person place time and situation. Patient answers questions appropriately and engages eye contact and verbal dialect with provider.   HENT:     Head: Normocephalic and atraumatic.     Right Ear: External ear normal.     Left Ear: External ear normal.     Nose: Nose normal.     Mouth/Throat:     Mouth: Mucous membranes are moist.     Pharynx: No oropharyngeal exudate or posterior oropharyngeal erythema.  Eyes:     Conjunctiva/sclera: Conjunctivae normal.  Neck:     Vascular: No carotid bruit.  Cardiovascular:     Rate and Rhythm: Normal rate and regular rhythm.     Pulses: Normal pulses.     Heart sounds: Normal heart sounds. No murmur heard. No friction rub. No gallop.   Pulmonary:     Effort: Pulmonary effort is normal. No respiratory distress.     Breath sounds: Normal breath sounds. No stridor. No wheezing, rhonchi or rales.  Chest:     Chest wall: No tenderness.  Abdominal:     Palpations: Abdomen is soft.  Musculoskeletal:        General: Normal range of motion.     Cervical back:  Normal range of motion and neck supple. No tenderness.     Right lower leg: No edema.     Left lower leg: No edema.  Lymphadenopathy:     Cervical: No cervical adenopathy.  Skin:    General: Skin is warm.     Findings: No erythema.  Neurological:     General: No focal deficit present.     Mental Status: She is oriented to person, place, and time.     Gait: Gait normal.     Deep Tendon Reflexes: Reflexes normal.  Psychiatric:        Mood and Affect: Mood normal.        Behavior: Behavior normal.        Thought Content: Thought content normal.        Judgment: Judgment normal.     BP 120/72 (BP Location: Left Arm, Patient Position: Sitting, Cuff Size: Normal)   Pulse 71   Temp 98.1 F (36.7 C)   Ht 5' 2.01" (1.575 m)   Wt 154 lb 3.2 oz (69.9 kg)   SpO2 99%   BMI 28.20 kg/m  Wt Readings from Last 3 Encounters:  12/19/20 154 lb 3.2 oz (69.9 kg)  12/05/20 155 lb 3.2 oz (70.4 kg)  07/08/18 154 lb (69.9 kg)    Health Maintenance Due  Topic Date Due  . TETANUS/TDAP  Never done    There are no preventive care reminders to display for this patient.   Lab Results  Component Value Date   TSH 16.47 (H) 12/07/2020   Lab Results  Component Value Date   WBC 4.7 12/07/2020   HGB 14.1 12/07/2020   HCT 41.7 12/07/2020   MCV 96.8 12/07/2020   PLT 299.0 12/07/2020   Lab Results  Component Value Date   NA 141 12/07/2020   K 4.2 12/07/2020   CO2 30 12/07/2020   GLUCOSE 108 (H) 12/07/2020   BUN 16 12/07/2020   CREATININE 0.89 12/07/2020   BILITOT 0.6 12/07/2020   ALKPHOS 69 12/07/2020   AST 19 12/07/2020   ALT 20 12/07/2020   PROT 7.2 12/07/2020   ALBUMIN 4.3 12/07/2020   CALCIUM 9.8 12/07/2020   GFR 66.96 12/07/2020   Lab Results  Component Value Date   CHOL 209 (H) 05/18/2019   Lab Results  Component Value Date   HDL 84.50 05/18/2019   Lab Results  Component Value Date   LDLCALC 111 (H) 05/18/2019   Lab Results  Component Value Date   TRIG 65.0  05/18/2019   Lab Results  Component Value Date   CHOLHDL 2 05/18/2019   Lab Results  Component Value Date   HGBA1C 5.8 05/18/2019       Assessment & Plan:   Problem List Items Addressed This Visit      Endocrine   Hypothyroidism - Primary     Continue current dosage of Synthroid 150 mcg po once daily.  Recheck lab TSH advised in 4-6 weeks. Will address medication dose per lab results.   No orders of the defined types were placed in this encounter.  Future lab orders are in.   Return in about 3 months (around 03/21/2021), or if symptoms worsen or fail to improve, for at any time for any worsening symptoms, Go to Emergency room/ urgent care if worse. Return precautions given. Red Flags discussed. The patient was given clear instructions to go to ER or return to medical center if any red flags develop, symptoms do not improve, worsen or new problems develop. They verbalized understanding.    Risks, benefits, and alternatives of the medications and treatment plan prescribed today were discussed, and patient expressed understanding.    Education regarding symptom management and diagnosis given to patient  on AVS.  Patient was in agreement with treatment plan.   Continue to follow with  Kelby Aline. Cornie Herrington AGNP-C, FNP-C for routine health maintenance.   Kelby Aline. Osbaldo Mark AGNP-C, FNP-C  Marcille Buffy, FNP

## 2020-12-19 ENCOUNTER — Other Ambulatory Visit: Payer: Self-pay

## 2020-12-19 ENCOUNTER — Ambulatory Visit (INDEPENDENT_AMBULATORY_CARE_PROVIDER_SITE_OTHER): Payer: Medicare HMO | Admitting: Adult Health

## 2020-12-19 ENCOUNTER — Encounter: Payer: Self-pay | Admitting: Adult Health

## 2020-12-19 VITALS — BP 120/72 | HR 71 | Temp 98.1°F | Ht 62.01 in | Wt 154.2 lb

## 2020-12-19 DIAGNOSIS — E039 Hypothyroidism, unspecified: Secondary | ICD-10-CM

## 2020-12-19 NOTE — Patient Instructions (Addendum)
Need to recheck TSH lab in 4- 6 weeks.   Levothyroxine tablets What is this medicine? LEVOTHYROXINE (lee voe thye ROX een) is a thyroid hormone. This medicine can improve symptoms of thyroid deficiency such as slow speech, lack of energy, weight gain, hair loss, dry skin, and feeling cold. It also helps to treat goiter (an enlarged thyroid gland). It is also used to treat some kinds of thyroid cancer along with surgery and other medicines. This medicine may be used for other purposes; ask your health care provider or pharmacist if you have questions. COMMON BRAND NAME(S): Estre, Euthyrox, Levo-T, Levothroid, Levoxyl, Synthroid, Thyro-Tabs, Unithroid What should I tell my health care provider before I take this medicine? They need to know if you have any of these conditions:  Addison's disease or other adrenal gland problem  angina  bone problems  diabetes  dieting or on a weight loss program  fertility problems  heart disease  pituitary gland problem  take medicines that treat or prevent blood clots  an unusual or allergic reaction to levothyroxine, thyroid hormones, other medicines, foods, dyes, or preservatives  pregnant or trying to get pregnant  breast-feeding How should I use this medicine? Take this medicine by mouth with plenty of water. It is best to take on an empty stomach, at least 30 minutes to one hour before breakfast. Avoid taking antacids containing aluminum or magnesium, simethicone, bile acid sequestrants, calcium carbonate, sodium polystyrene sulfonate, ferrous sulfate, sevelamer, lanthanum, or sucralfate within 4 hours of taking this medicine. Follow the directions on the prescription label. Take at the same time each day. Do not take your medicine more often than directed. Contact your pediatrician regarding the use of this medicine in children. While this drug may be prescribed for children and infants as young as a few days of age for selected conditions,  precautions do apply. For infants, you may crush the tablet and place in a small amount of (5 to 10 mL or 1 to 2 teaspoonfuls) of water, breast milk, or non-soy based infant formula. Do not mix with soy-based infant formula. Give as directed. Overdosage: If you think you have taken too much of this medicine contact a poison control center or emergency room at once. NOTE: This medicine is only for you. Do not share this medicine with others. What if I miss a dose? If you miss a dose, take it as soon as you can. If it is almost time for your next dose, take only that dose. Do not take double or extra doses. What may interact with this medicine?  amiodarone  antacids  anti-thyroid medicines  calcium supplements  carbamazepine  certain medicines for depression  certain medicines to treat cancer  cholestyramine  clofibrate  colesevelam  colestipol  digoxin  female hormones, like estrogens and birth control pills, patches, rings, or injections  iron supplements  ketamine  lanthanum  liquid nutrition products like Ensure  lithium  medicines for colds and breathing difficulties  medicines for diabetes  medicines or dietary supplements for weight loss  methadone  niacin  orlistat  oxandrolone  phenobarbital or other barbiturates  phenytoin  rifampin  sevelamer  simethicone  sodium polystyrene sulfonate  soy isoflavones  steroid medicines like prednisone or cortisone  sucralfate  testosterone  theophylline  warfarin This list may not describe all possible interactions. Give your health care provider a list of all the medicines, herbs, non-prescription drugs, or dietary supplements you use. Also tell them if you smoke, drink alcohol,  or use illegal drugs. Some items may interact with your medicine. What should I watch for while using this medicine? Be sure to take this medicine with plenty of fluids. Some tablets may cause choking, gagging, or  difficulty swallowing from the tablet getting stuck in your throat. Most of these problems disappear if the medicine is taken with the right amount of water or other fluids. Do not switch brands of this medicine unless your health care professional agrees with the change. Ask questions if you are uncertain. You will need regular exams and occasional blood tests to check the response to treatment. If you are receiving this medicine for an underactive thyroid, it may be several weeks before you notice an improvement. Check with your doctor or health care professional if your symptoms do not improve. It may be necessary for you to take this medicine for the rest of your life. Do not stop using this medicine unless your doctor or health care professional advises you to. This medicine can affect blood sugar levels. If you have diabetes, check your blood sugar as directed. You may lose some of your hair when you first start treatment. With time, this usually corrects itself. If you are going to have surgery, tell your doctor or health care professional that you are taking this medicine. What side effects may I notice from receiving this medicine? Side effects that you should report to your doctor or health care professional as soon as possible:  allergic reactions like skin rash, itching or hives, swelling of the face, lips, or tongue  anxious  breathing problems  changes in menstrual periods  chest pain  diarrhea  excessive sweating or intolerance to heat  fast or irregular heartbeat  leg cramps  nervousness  swelling of ankles, feet, or legs  tremors  trouble sleeping  vomiting Side effects that usually do not require medical attention (report to your doctor or health care professional if they continue or are bothersome):  changes in appetite  headache  irritable  nausea  weight loss This list may not describe all possible side effects. Call your doctor for medical advice  about side effects. You may report side effects to FDA at 1-800-FDA-1088. Where should I keep my medicine? Keep out of the reach of children. Store at room temperature between 15 and 30 degrees C (59 and 86 degrees F). Protect from light and moisture. Keep container tightly closed. Throw away any unused medicine after the expiration date. NOTE: This sheet is a summary. It may not cover all possible information. If you have questions about this medicine, talk to your doctor, pharmacist, or health care provider.  2021 Elsevier/Gold Standard (2019-07-15 13:39:26)

## 2021-01-02 ENCOUNTER — Other Ambulatory Visit: Payer: Self-pay | Admitting: Family

## 2021-01-02 DIAGNOSIS — I7300 Raynaud's syndrome without gangrene: Secondary | ICD-10-CM

## 2021-01-08 ENCOUNTER — Telehealth: Payer: Self-pay | Admitting: Adult Health

## 2021-01-08 NOTE — Telephone Encounter (Signed)
Left message for patient to call back and schedule Medicare Annual Wellness Visit (AWV) in office.   If not able to come in office, please offer to do virtually or by telephone.   Due for AWVI  Please schedule at anytime with Nurse Health Advisor.   

## 2021-01-23 ENCOUNTER — Other Ambulatory Visit (INDEPENDENT_AMBULATORY_CARE_PROVIDER_SITE_OTHER): Payer: Medicare HMO

## 2021-01-23 ENCOUNTER — Other Ambulatory Visit: Payer: Self-pay

## 2021-01-23 DIAGNOSIS — E039 Hypothyroidism, unspecified: Secondary | ICD-10-CM

## 2021-01-23 LAB — TSH: TSH: 0.02 u[IU]/mL — ABNORMAL LOW (ref 0.35–4.50)

## 2021-01-24 ENCOUNTER — Other Ambulatory Visit: Payer: Self-pay | Admitting: Adult Health

## 2021-01-24 DIAGNOSIS — E039 Hypothyroidism, unspecified: Secondary | ICD-10-CM

## 2021-01-24 MED ORDER — LEVOTHYROXINE SODIUM 125 MCG PO TABS: 125.0000 ug | ORAL_TABLET | Freq: Every day | ORAL | 0 refills | Status: DC

## 2021-01-24 NOTE — Progress Notes (Signed)
Meds ordered this encounter  Medications   levothyroxine (SYNTHROID) 125 MCG tablet    Sig: Take 1 tablet (125 mcg total) by mouth daily.    Dispense:  90 tablet    Refill:  0

## 2021-01-25 ENCOUNTER — Encounter: Payer: Self-pay | Admitting: Adult Health

## 2021-01-26 ENCOUNTER — Other Ambulatory Visit: Payer: Self-pay | Admitting: Adult Health

## 2021-01-26 DIAGNOSIS — I7300 Raynaud's syndrome without gangrene: Secondary | ICD-10-CM

## 2021-01-29 NOTE — Telephone Encounter (Signed)
The prescription that was sent in was for Synthroid 125 mcg

## 2021-01-29 NOTE — Telephone Encounter (Signed)
Noted  

## 2021-01-29 NOTE — Telephone Encounter (Signed)
Called patient to clarify she was taking levothyroxine 150 mcg daly from 12/07/20 until TSH taken on 01/23/21. If so patient would need to start back on 125 mcg daily of Levothyroxine taking on an empty stomach 1 hour before any meal or milk product.

## 2021-01-30 ENCOUNTER — Other Ambulatory Visit: Payer: Self-pay | Admitting: Adult Health

## 2021-01-30 ENCOUNTER — Telehealth: Payer: Self-pay

## 2021-01-30 ENCOUNTER — Other Ambulatory Visit: Payer: Self-pay | Admitting: Family

## 2021-01-30 DIAGNOSIS — E039 Hypothyroidism, unspecified: Secondary | ICD-10-CM

## 2021-01-30 DIAGNOSIS — I7300 Raynaud's syndrome without gangrene: Secondary | ICD-10-CM

## 2021-01-30 MED ORDER — LEVOTHYROXINE SODIUM 137 MCG PO TABS: 137.0000 ug | ORAL_TABLET | Freq: Every day | ORAL | 3 refills | Status: DC

## 2021-01-30 NOTE — Telephone Encounter (Signed)
Pt called but orders are not in for labs

## 2021-01-30 NOTE — Telephone Encounter (Signed)
Lab order placed for TSH for future labs

## 2021-03-02 ENCOUNTER — Other Ambulatory Visit: Payer: Self-pay

## 2021-03-02 ENCOUNTER — Other Ambulatory Visit (INDEPENDENT_AMBULATORY_CARE_PROVIDER_SITE_OTHER): Payer: Medicare HMO

## 2021-03-02 DIAGNOSIS — E039 Hypothyroidism, unspecified: Secondary | ICD-10-CM

## 2021-03-02 LAB — TSH: TSH: 0.04 u[IU]/mL — ABNORMAL LOW (ref 0.35–5.50)

## 2021-03-04 ENCOUNTER — Other Ambulatory Visit: Payer: Self-pay | Admitting: Adult Health

## 2021-03-04 DIAGNOSIS — E039 Hypothyroidism, unspecified: Secondary | ICD-10-CM

## 2021-03-22 ENCOUNTER — Encounter: Payer: Self-pay | Admitting: Adult Health

## 2021-03-26 ENCOUNTER — Emergency Department: Payer: Medicare HMO

## 2021-03-26 ENCOUNTER — Inpatient Hospital Stay
Admission: EM | Admit: 2021-03-26 | Discharge: 2021-03-28 | DRG: 310 | Disposition: A | Discharge status: Home / Self Care | Payer: Medicare HMO | Attending: Internal Medicine | Admitting: Internal Medicine

## 2021-03-26 ENCOUNTER — Other Ambulatory Visit: Payer: Self-pay

## 2021-03-26 ENCOUNTER — Ambulatory Visit (INDEPENDENT_AMBULATORY_CARE_PROVIDER_SITE_OTHER): Payer: Medicare HMO | Admitting: Family Medicine

## 2021-03-26 VITALS — BP 120/80 | HR 92 | Temp 98.7°F | Ht 62.0 in | Wt 152.4 lb

## 2021-03-26 DIAGNOSIS — R002 Palpitations: Secondary | ICD-10-CM | POA: Diagnosis not present

## 2021-03-26 DIAGNOSIS — I7300 Raynaud's syndrome without gangrene: Secondary | ICD-10-CM | POA: Diagnosis not present

## 2021-03-26 DIAGNOSIS — E039 Hypothyroidism, unspecified: Secondary | ICD-10-CM | POA: Diagnosis present

## 2021-03-26 DIAGNOSIS — Z20822 Contact with and (suspected) exposure to covid-19: Secondary | ICD-10-CM | POA: Diagnosis not present

## 2021-03-26 DIAGNOSIS — R7989 Other specified abnormal findings of blood chemistry: Secondary | ICD-10-CM

## 2021-03-26 DIAGNOSIS — Z8616 Personal history of COVID-19: Secondary | ICD-10-CM | POA: Diagnosis not present

## 2021-03-26 DIAGNOSIS — I4892 Unspecified atrial flutter: Principal | ICD-10-CM

## 2021-03-26 DIAGNOSIS — K219 Gastro-esophageal reflux disease without esophagitis: Secondary | ICD-10-CM

## 2021-03-26 DIAGNOSIS — Z79899 Other long term (current) drug therapy: Secondary | ICD-10-CM

## 2021-03-26 DIAGNOSIS — Z79890 Hormone replacement therapy (postmenopausal): Secondary | ICD-10-CM | POA: Diagnosis not present

## 2021-03-26 DIAGNOSIS — R000 Tachycardia, unspecified: Secondary | ICD-10-CM | POA: Diagnosis not present

## 2021-03-26 DIAGNOSIS — Z8249 Family history of ischemic heart disease and other diseases of the circulatory system: Secondary | ICD-10-CM

## 2021-03-26 DIAGNOSIS — Z880 Allergy status to penicillin: Secondary | ICD-10-CM

## 2021-03-26 DIAGNOSIS — Z7901 Long term (current) use of anticoagulants: Secondary | ICD-10-CM | POA: Diagnosis not present

## 2021-03-26 DIAGNOSIS — I480 Paroxysmal atrial fibrillation: Secondary | ICD-10-CM | POA: Diagnosis not present

## 2021-03-26 DIAGNOSIS — I4891 Unspecified atrial fibrillation: Secondary | ICD-10-CM | POA: Diagnosis not present

## 2021-03-26 LAB — CBC
HCT: 40.6 % (ref 36.0–46.0)
HCT: 44.5 % (ref 36.0–46.0)
Hemoglobin: 13.7 g/dL (ref 12.0–15.0)
Hemoglobin: 15.4 g/dL — ABNORMAL HIGH (ref 12.0–15.0)
MCH: 31.5 pg (ref 26.0–34.0)
MCH: 33.3 pg (ref 26.0–34.0)
MCHC: 33.7 g/dL (ref 30.0–36.0)
MCHC: 34.6 g/dL (ref 30.0–36.0)
MCV: 93.3 fL (ref 80.0–100.0)
MCV: 96.1 fL (ref 80.0–100.0)
Platelets: 266 10*3/uL (ref 150–400)
Platelets: 307 10*3/uL (ref 150–400)
RBC: 4.35 MIL/uL (ref 3.87–5.11)
RBC: 4.63 MIL/uL (ref 3.87–5.11)
RDW: 13.1 % (ref 11.5–15.5)
RDW: 13.2 % (ref 11.5–15.5)
WBC: 5.8 10*3/uL (ref 4.0–10.5)
WBC: 7 10*3/uL (ref 4.0–10.5)
nRBC: 0 % (ref 0.0–0.2)
nRBC: 0 % (ref 0.0–0.2)

## 2021-03-26 LAB — BASIC METABOLIC PANEL
Anion gap: 10 (ref 5–15)
BUN: 18 mg/dL (ref 8–23)
CO2: 27 mmol/L (ref 22–32)
Calcium: 9.9 mg/dL (ref 8.9–10.3)
Chloride: 102 mmol/L (ref 98–111)
Creatinine, Ser: 0.68 mg/dL (ref 0.44–1.00)
GFR, Estimated: >60 mL/min (ref >60)
Glucose, Bld: 103 mg/dL — ABNORMAL HIGH (ref 70–99)
Potassium: 4.6 mmol/L (ref 3.5–5.1)
Sodium: 139 mmol/L (ref 135–145)

## 2021-03-26 LAB — TROPONIN I (HIGH SENSITIVITY): Troponin I (High Sensitivity): 7 ng/L (ref <18)

## 2021-03-26 LAB — TSH: TSH: 0.232 u[IU]/mL — ABNORMAL LOW (ref 0.350–4.500)

## 2021-03-26 LAB — MAGNESIUM: Magnesium: 2 mg/dL (ref 1.7–2.4)

## 2021-03-26 LAB — T4, FREE: Free T4: 1.13 ng/dL — ABNORMAL HIGH (ref 0.61–1.12)

## 2021-03-26 MED ORDER — ACETAMINOPHEN 325 MG PO TABS: 650.0000 mg | ORAL_TABLET | Freq: Four times a day (QID) | ORAL | Status: DC | PRN

## 2021-03-26 MED ORDER — APIXABAN 5 MG PO TABS: 5.0000 mg | ORAL_TABLET | Freq: Two times a day (BID) | ORAL | 0 refills | Status: DC

## 2021-03-26 MED ORDER — ACETAMINOPHEN 650 MG RE SUPP: 650.0000 mg | Freq: Four times a day (QID) | RECTAL | Status: DC | PRN

## 2021-03-26 MED ORDER — ONDANSETRON HCL 4 MG PO TABS: 4.0000 mg | ORAL_TABLET | Freq: Four times a day (QID) | ORAL | Status: DC | PRN

## 2021-03-26 MED ORDER — DILTIAZEM HCL 25 MG/5ML IV SOLN
10.0000 mg | Freq: Once | INTRAVENOUS | Status: AC
  Administered 2021-03-26: 10 mg via INTRAVENOUS
  Filled 2021-03-26: qty 5

## 2021-03-26 MED ORDER — DILTIAZEM HCL ER COATED BEADS 120 MG PO CP24: 120.0000 mg | ORAL_CAPSULE | Freq: Every day | ORAL | 0 refills | Status: DC

## 2021-03-26 MED ORDER — SODIUM CHLORIDE 0.9 % IV BOLUS
500.0000 mL | Freq: Once | INTRAVENOUS | Status: AC
  Administered 2021-03-26: 500 mL via INTRAVENOUS

## 2021-03-26 MED ORDER — DILTIAZEM HCL 60 MG PO TABS
30.0000 mg | ORAL_TABLET | Freq: Once | ORAL | Status: AC
  Administered 2021-03-26: 30 mg via ORAL
  Filled 2021-03-26: qty 1

## 2021-03-26 MED ORDER — DILTIAZEM HCL 25 MG/5ML IV SOLN
10.0000 mg | Freq: Once | INTRAVENOUS | Status: AC
  Administered 2021-03-26: 10 mg via INTRAVENOUS

## 2021-03-26 MED ORDER — DILTIAZEM HCL-DEXTROSE 125-5 MG/125ML-% IV SOLN (PREMIX)
5.0000 mg/h | INTRAVENOUS | Status: DC
  Administered 2021-03-26: 5 mg/h via INTRAVENOUS
  Administered 2021-03-27 (×3): 12.5 mg/h via INTRAVENOUS
  Filled 2021-03-26 (×3): qty 125

## 2021-03-26 MED ORDER — APIXABAN 5 MG PO TABS
5.0000 mg | ORAL_TABLET | Freq: Once | ORAL | Status: AC
  Administered 2021-03-26: 5 mg via ORAL
  Filled 2021-03-26: qty 1

## 2021-03-26 MED ORDER — ENOXAPARIN SODIUM 40 MG/0.4ML IJ SOSY
40.0000 mg | PREFILLED_SYRINGE | INTRAMUSCULAR | Status: DC
  Administered 2021-03-27: 40 mg via SUBCUTANEOUS
  Filled 2021-03-26: qty 0.4

## 2021-03-26 MED ORDER — ONDANSETRON HCL 4 MG/2ML IJ SOLN: 4.0000 mg | Freq: Four times a day (QID) | INTRAMUSCULAR | Status: DC | PRN

## 2021-03-26 NOTE — ED Provider Notes (Signed)
10:51 PM heart rates over 100 and with ambulation up to 145.  Although patient is well-appearing we discussed diltiazem drip and admission   Concha Se, MD 03/26/21 2252

## 2021-03-26 NOTE — Assessment & Plan Note (Addendum)
This is an ongoing intermittent issue.  She was very tachycardic on exam.  EKG revealed atrial fibrillation with RVR.  She notes no symptoms currently.  Discussed the need for evaluation in the ED to help reduce her heart rate.  We did discuss the the need for rate controlling agent long-term as well as an anticoagulant long-term to reduce her risk of stroke.  The patient was willing to go to the emergency department.  Given her lack of symptoms she will travel by private vehicle.  I did advise that if she develops any symptoms she should follow-up and contact EMS.  Her CHA2DS2-VASc score is a 2.  She does meet criteria for anticoagulation and I discussed that I typically use Eliquis given its lower risk of bleeding.  We will plan on following up with her after she is released from the emergency room or the hospital.

## 2021-03-26 NOTE — ED Provider Notes (Signed)
[facility] Emergency Department Provider Note  ____________________________________________   Event Date/Time   First MD Initiated Contact with Patient 03/26/21 1949     (approximate)  I have reviewed the triage vital signs and the nursing notes.   HISTORY  Chief Complaint Tachycardia    HPI Alexis Mcintosh is a 70 y.o. female with past medical history as below including hypothyroidism currently on replacement, here with palpitations.  The patient states that for the last several weeks to months, she has felt intermittent episodes of palpitations.  She has been having her thyroid medication adjusted by her PCP and she was actually hyperthyroid fairly recently.  This was decreased.  She states that over the last several days, she has noticed intermittent and increasingly frequent palpitations.  Denies any associated chest pain, shortness of breath, lightheadedness, or dizziness.  She went to her PCP today for this and was sent in for new onset atrial fibrillation.  No history of atrial fibrillation.  She has been taking her other medications as prescribed.  She does take nifedipine for history of Raynaud's.  She does have some mild low blood pressure at baseline, but denies any history of bradycardia.  No other complaints.  Denies any current chest pain or other symptoms other than mild palpitations, which have been intermittent since she presented to the ED.    Past Medical History:  Diagnosis Date   Thyroid disease     Patient Active Problem List   Diagnosis Date Noted   Palpitations 03/26/2021   Osteopenia 08/16/2019   Seasonal allergies 07/08/2018   Hives 06/16/2017   GERD (gastroesophageal reflux disease) 06/16/2017   Skin lesion 06/16/2017   Screening for breast cancer 12/16/2016   Eczema 12/12/2016   Raynaud phenomenon 12/12/2016   Arthritis 12/12/2016   Hypothyroidism 12/12/2016    Past Surgical History:  Procedure Laterality Date    APPENDECTOMY      Prior to Admission medications   Medication Sig Start Date End Date Taking? Authorizing Provider  apixaban (ELIQUIS) 5 MG TABS tablet Take 1 tablet (5 mg total) by mouth 2 (two) times daily. 03/26/21 04/25/21 Yes Shaune Pollack, MD  diltiazem (CARDIZEM CD) 120 MG 24 hr capsule Take 1 capsule (120 mg total) by mouth daily. 03/26/21 04/25/21 Yes Shaune Pollack, MD  Cholecalciferol (VITAMIN D3) 2000 units TABS Take by mouth.    [provider]  Coenzyme Q10 200 MG capsule Take 200 mg by mouth daily.    [provider]  desonide (DESOWEN) 0.05 % lotion Apply 2 (two) times daily topically. 06/16/17   Allegra Grana, FNP  fexofenadine (ALLEGRA) 180 MG tablet TAKE 1 TABLET BY MOUTH EVERY DAY 07/03/20   Allegra Grana, FNP  hydrOXYzine (ATARAX/VISTARIL) 50 MG tablet TAKE 2 TABLETS (100 MG TOTAL) BY MOUTH AT BEDTIME. 09/05/20   Allegra Grana, FNP  levothyroxine (SYNTHROID) 137 MCG tablet Take 1 tablet (137 mcg total) by mouth daily before breakfast. 01/30/21   Worthy Rancher B, FNP  Melatonin 5 MG TABS Take by mouth.    [provider]  mometasone (NASONEX) 50 MCG/ACT nasal spray PLACE 2 SPRAYS INTO THE NOSE DAILY. 10/04/20   Allegra Grana, FNP  Multiple Vitamin (MULTIVITAMIN) tablet Take 1 tablet by mouth daily.    [provider]  Omega-3 Fatty Acids (FISH OIL) 1000 MG CAPS Take by mouth.    [provider]  omeprazole (PRILOSEC) 20 MG capsule TAKE 1 CAPSULE BY MOUTH EVERY DAY 07/03/20  Allegra Grana, FNP    Allergies Amoxicillin  Family History  Problem Relation Age of Onset   Cancer Mother        Breast   Hypertension Mother    Diabetes Mother    Breast cancer Mother 71   Stroke Father    Hypertension Father    Breast cancer Paternal Grandmother 17    Social History Social History   Tobacco Use   Smoking status: Never   Smokeless tobacco: Never  Substance Use Topics   Alcohol use: Yes   Drug use: No     Review of Systems  Review of Systems  Constitutional:  Positive for fatigue. Negative for fever.  HENT:  Negative for congestion and sore throat.   Eyes:  Negative for visual disturbance.  Respiratory:  Negative for cough and shortness of breath.   Cardiovascular:  Positive for palpitations. Negative for chest pain.  Gastrointestinal:  Negative for abdominal pain, diarrhea, nausea and vomiting.  Genitourinary:  Negative for flank pain.  Musculoskeletal:  Negative for back pain and neck pain.  Skin:  Negative for rash and wound.  Neurological:  Negative for weakness.  All other systems reviewed and are negative.   ____________________________________________  PHYSICAL EXAM:      VITAL SIGNS: ED Triage Vitals  Enc Vitals Group     BP 03/26/21 1247 (!) 143/89     Pulse Rate 03/26/21 1247 (!) 127     Resp 03/26/21 1247 19     Temp 03/26/21 1247 98.7 F (37.1 C)     Temp Source 03/26/21 1247 Oral     SpO2 03/26/21 1247 98 %     Weight --      Height --      Head Circumference --      Peak Flow --      Pain Score 03/26/21 1239 0     Pain Loc --      Pain Edu? --      Excl. in GC? --      Physical Exam Vitals and nursing note reviewed.  Constitutional:      General: She is not in acute distress.    Appearance: She is well-developed.  HENT:     Head: Normocephalic and atraumatic.  Eyes:     Conjunctiva/sclera: Conjunctivae normal.  Cardiovascular:     Rate and Rhythm: Tachycardia present. Rhythm irregular.     Heart sounds: Normal heart sounds. No murmur heard.   No friction rub.  Pulmonary:     Effort: Pulmonary effort is normal. No respiratory distress.     Breath sounds: Normal breath sounds. No wheezing or rales.  Abdominal:     General: There is no distension.     Palpations: Abdomen is soft.     Tenderness: There is no abdominal tenderness.  Musculoskeletal:     Cervical back: Neck supple.  Skin:    General: Skin is warm.     Capillary Refill:  Capillary refill takes less than 2 seconds.  Neurological:     Mental Status: She is alert and oriented to person, place, and time.     Motor: No abnormal muscle tone.      ____________________________________________   LABS (all labs ordered are listed, but only abnormal results are displayed)  Labs Reviewed  BASIC METABOLIC PANEL - Abnormal; Notable for the following components:      Result Value   Glucose, Bld 103 (*)    All other components within normal limits  CBC - Abnormal; Notable for the following components:   Hemoglobin 15.4 (*)    All other components within normal limits  TSH - Abnormal; Notable for the following components:   TSH 0.232 (*)    All other components within normal limits  T4, FREE - Abnormal; Notable for the following components:   Free T4 1.13 (*)    All other components within normal limits  MAGNESIUM  TROPONIN I (HIGH SENSITIVITY)  TROPONIN I (HIGH SENSITIVITY)    ____________________________________________  EKG: Atrial fibrillation, ventricular rate 147.  QRS 72, QTc 372.  Nonspecific T wave changes.  No acute ST elevations.  Likely rate related changes. ________________________________________  RADIOLOGY All imaging, including plain films, CT scans, and ultrasounds, independently reviewed by me, and interpretations confirmed via formal radiology reads.  ED MD interpretation:   Chest x-ray: No acute cardiopulmonary disease  Official radiology report(s): DG Chest 2 View  Result Date: 03/26/2021 CLINICAL DATA:  Tachycardia. EXAM: CHEST - 2 VIEW COMPARISON:  None. FINDINGS: The heart size and mediastinal contours are within normal limits. Both lungs are clear. The visualized skeletal structures are unremarkable. IMPRESSION: No active cardiopulmonary disease. Electronically Signed   By: Kennith CenterEric  Mansell M.D.   On: 03/26/2021 13:32    ____________________________________________  PROCEDURES   Procedure(s) performed (including Critical  Care):  Procedures  ____________________________________________  INITIAL IMPRESSION / MDM / ASSESSMENT AND PLAN / ED COURSE  As part of my medical decision making, I reviewed the following data within the electronic MEDICAL RECORD NUMBER Nursing notes reviewed and incorporated, Old chart reviewed, Notes from prior ED visits, and [place] Controlled Substance Database       *Kasandra KnudsenSheila Westergren was evaluated in Emergency Department on 03/26/2021 for the symptoms described in the history of present illness. She was evaluated in the context of the global COVID-19 pandemic, which necessitated consideration that the patient might be at risk for infection with the SARS-CoV-2 virus that causes COVID-19. Institutional protocols and algorithms that pertain to the evaluation of patients at risk for COVID-19 are in a state of rapid change based on information released by regulatory bodies including the CDC and federal and state organizations. These policies and algorithms were followed during the patient's care in the ED.  Some ED evaluations and interventions may be delayed as a result of limited staffing during the pandemic.*     Medical Decision Making:  70 yo female here with new onset A. fib RVR.  This could be related to her history of hyperthyroidism versus primary A. fib.  She is overall very well-appearing and in no distress.  EKG shows heart rate in the 140s with some nonspecific T wave changes.  She has no chest pain or signs of ACS.  Patient will be given a dose of diltiazem.  She does have a history of Raynaud's on nifedipine so can likely switch to diltiazem for additional rate control.  She has had some mild hypotension in the past so will need to be cautious with this.  CHA2DS2-VASc score is 2-3 depending on inclusion of Raynaud's on vascular history, will start on Eliquis. Risks/benefits of anticoagulation discussed.  Heart rate improving significantly after single dose of IV dill.  Will give an additional  dose with the p.o. long-acting developed.  Plan to reassess.  If she remains with heart rate controlled and otherwise asymptomatic and is able to ambulate without difficulty or return of rapid rate, would recommend discharge with outpatient follow-up.  I prescribed diltiazem as well as Eliquis.  She was counseled on the risk benefits of these medications as well as the need to stop nifedipine in place of diltiazem.  ____________________________________________  FINAL CLINICAL IMPRESSION(S) / ED DIAGNOSES  Final diagnoses:  Paroxysmal atrial fibrillation (HCC)     MEDICATIONS GIVEN DURING THIS VISIT:  Medications  diltiazem (CARDIZEM) injection 10 mg (has no administration in time range)  diltiazem (CARDIZEM) injection 10 mg (10 mg Intravenous Given 03/26/21 2025)  sodium chloride 0.9 % bolus 500 mL (0 mLs Intravenous Stopped 03/26/21 2118)  apixaban (ELIQUIS) tablet 5 mg (5 mg Oral Given 03/26/21 2039)  diltiazem (CARDIZEM) tablet 30 mg (30 mg Oral Given 03/26/21 2107)     ED Discharge Orders          Ordered    diltiazem (CARDIZEM CD) 120 MG 24 hr capsule  Daily        03/26/21 2114    apixaban (ELIQUIS) 5 MG TABS tablet  2 times daily        03/26/21 2114             Note:  This document was prepared using Dragon voice recognition software and may include unintentional dictation errors.   Shaune Pollack, MD 03/26/21 2128

## 2021-03-26 NOTE — Progress Notes (Signed)
Marikay Alar, MD Phone: [phone]  Alexis Mcintosh is a 70 y.o. female who presents today for f/u.  HYPOTHYROIDISM Disease Monitoring Weight changes: no  Skin Changes: no Palpitations: yes Heat/Cold intolerance: no  Medication Monitoring Compliance:  taking synthroid 125 mcg daily, down from 150 mcg daily, patient reports that she was previously taking her Synthroid with all of her other medicines.  Around May she started take it on its own in the morning. Last TSH:   Lab Results  Component Value Date   TSH 0.04 (L) 03/02/2021   GERD:   Reflux symptoms: rare   Abd pain: no   Blood in stool: notes she has hemorrhoids  Dysphagia: no   EGD: in the distant past before she had reflux  Medication: omeprazole daily  Palpitations: These have been an ongoing intermittent issue since May.  She will have periods of time where her heart rate will go up into the 140s.  She notes no chest pain or shortness of breath.  She currently does not feel any palpitations.  It appears this was initially felt to be related to her taking Sudafed though she discontinued that and her symptoms have persisted.   Social History   Tobacco Use  Smoking Status Never  Smokeless Tobacco Never    Current Outpatient Medications on File Prior to Visit  Medication Sig Dispense Refill   Cholecalciferol (VITAMIN D3) 2000 units TABS Take by mouth.     Coenzyme Q10 200 MG capsule Take 200 mg by mouth daily.     desonide (DESOWEN) 0.05 % lotion Apply 2 (two) times daily topically. 59 mL 0   Fenoprofen Calcium (NALFON) 200 MG CAPS capsule Take 200 mg by mouth.     fexofenadine (ALLEGRA) 180 MG tablet TAKE 1 TABLET BY MOUTH EVERY DAY 90 tablet 3   hydrOXYzine (ATARAX/VISTARIL) 50 MG tablet TAKE 2 TABLETS (100 MG TOTAL) BY MOUTH AT BEDTIME. 180 tablet 3   levothyroxine (SYNTHROID) 137 MCG tablet Take 1 tablet (137 mcg total) by mouth daily before breakfast. 30 tablet 3   Melatonin 5 MG TABS Take by mouth.      mometasone (NASONEX) 50 MCG/ACT nasal spray PLACE 2 SPRAYS INTO THE NOSE DAILY. 3 each 3   Multiple Vitamin (MULTIVITAMIN) tablet Take 1 tablet by mouth daily.     NIFEdipine (ADALAT CC) 30 MG 24 hr tablet TAKE 1 TABLET BY MOUTH EVERY DAY 30 tablet 0   Omega-3 Fatty Acids (FISH OIL) 1000 MG CAPS Take by mouth.     omeprazole (PRILOSEC) 20 MG capsule TAKE 1 CAPSULE BY MOUTH EVERY DAY 90 capsule 4   No current facility-administered medications on file prior to visit.     ROS see history of present illness  Objective  Physical Exam Vitals:   03/26/21 1141  BP: 120/80  Pulse: 92  Temp: 98.7 F (37.1 C)  SpO2: 99%    BP Readings from Last 3 Encounters:  03/26/21 120/80  12/19/20 120/72  12/05/20 140/78   Wt Readings from Last 3 Encounters:  03/26/21 152 lb 6.4 oz (69.1 kg)  12/19/20 154 lb 3.2 oz (69.9 kg)  12/05/20 155 lb 3.2 oz (70.4 kg)    Physical Exam Constitutional:      General: She is not in acute distress.    Appearance: She is not diaphoretic.  Cardiovascular:     Rate and Rhythm: Tachycardia present. Rhythm irregular.     Heart sounds: Normal heart sounds.  Pulmonary:     Effort: Pulmonary  effort is normal.     Breath sounds: Normal breath sounds.  Skin:    General: Skin is warm and dry.  Neurological:     Mental Status: She is alert.   EKG: Atrial fibrillation with RVR, rate 146, nondiagnostic ST depressions  Assessment/Plan: Please see individual problem list.  Problem List Items Addressed This Visit     GERD (gastroesophageal reflux disease) - Primary    Rare symptoms.  She reports years of issues with reflux.  We will have her try to come off of the omeprazole by taking it every other day for 1 month discontinuing it.  If she has worsening of her symptoms or recurrence of her symptoms she can go back on the omeprazole.  Discussed the need to see GI for possible endoscopy if she has persistent reflux issues.      Hypothyroidism    The patient's  recent TSH has been low indicating excessive amount of Synthroid.  I suspect that is contributing to some of her palpitations.  Discussed that it takes up to 6 weeks for the TSH to equilibrate after her dose change of Synthroid.  We will plan on rechecking a TSH in a couple weeks.      Palpitations    This is an ongoing intermittent issue.  She was very tachycardic on exam.  EKG revealed atrial fibrillation with RVR.  She notes no symptoms currently.  Discussed the need for evaluation in the ED to help reduce her heart rate.  We did discuss the the need for rate controlling agent long-term as well as an anticoagulant long-term to reduce her risk of stroke.  The patient was willing to go to the emergency department.  Given her lack of symptoms she will travel by private vehicle.  I did advise that if she develops any symptoms she should follow-up and contact EMS.  Her CHA2DS2-VASc score is a 2.  She does meet criteria for anticoagulation and I discussed that I typically use Eliquis given its lower risk of bleeding.  We will plan on following up with her after she is released from the emergency room or the hospital.      Relevant Orders   EKG 12-Lead (Completed)    Return in about 1 week (around 04/02/2021).  This visit occurred during the SARS-CoV-2 public health emergency.  Safety protocols were in place, including screening questions prior to the visit, additional usage of staff PPE, and extensive cleaning of exam room while observing appropriate contact time as indicated for disinfecting solutions.   I have spent 43 minutes in the care of this patient regarding history taking, documentation, interpretation of EKG, completion of exam, discussion of plan and work-up of atrial fibrillation as well as ongoing treatment plan of atrial fibrillation, discussion of need to be evaluated emergency department.   Marikay Alar, MD [facility]

## 2021-03-26 NOTE — Assessment & Plan Note (Signed)
Rare symptoms.  She reports years of issues with reflux.  We will have her try to come off of the omeprazole by taking it every other day for 1 month discontinuing it.  If she has worsening of her symptoms or recurrence of her symptoms she can go back on the omeprazole.  Discussed the need to see GI for possible endoscopy if she has persistent reflux issues.

## 2021-03-26 NOTE — Assessment & Plan Note (Signed)
The patient's recent TSH has been low indicating excessive amount of Synthroid.  I suspect that is contributing to some of her palpitations.  Discussed that it takes up to 6 weeks for the TSH to equilibrate after her dose change of Synthroid.  We will plan on rechecking a TSH in a couple weeks.

## 2021-03-26 NOTE — ED Triage Notes (Signed)
Pt comes with c/o tachycardia. Pt states she can feel her heart beating fast. Pt denies any CP or SOb. Pt states this has been ongoing for awhile.

## 2021-03-26 NOTE — ED Notes (Signed)
Pt's labs were never drawn. Pt in triage room at this time getting labs drawn and rechecking vitals.

## 2021-03-26 NOTE — Discharge Instructions (Addendum)
STOP taking your NIFEDIPINE and your FENOPROFEN  IN PLACE of fenoporfen, ibuprofen, alleve, or other NSAID medications, take TYLENOL  START the diltiazem (for heart rate AND your Raynaud's)  START the Eliquis for stroke prevention  CALL your primary doctor to set up follow-up this week

## 2021-03-26 NOTE — Patient Instructions (Signed)
Please go to the ED

## 2021-03-26 NOTE — H&P (Signed)
History and Physical    Alexis Mcintosh DJM:[id number] DOB: 04/07/1951 DOA: 03/26/2021  PCP: Berniece Pap, FNP   Patient coming from: home  I have personally briefly reviewed patient's old medical records in [facility]  Chief Complaint: palpitations  HPI: Alexis Mcintosh is a 70 y.o. female with medical history significant for Hypothyroidism on Synthroid and GERD who was sent from her PCPs office for evaluation of new onset A. fib.  Patient had been having intermittent palpitations for quite some time and was found to be tachycardic in her doctor's office and EKG revealed A. fib with rate of 146 prompting referral to the ED given CHA2DS2-VASc of 2.  Patient denies chest pain, shortness of breath, recent illness.  Denies cough, fever or chills, nausea or vomiting, abdominal pain or diarrhea, leg pain or swelling  ED course: On arrival afebrile, BP 143/89 with heart rate 127 and O2 sat 98% on room air Blood work significant for TSH of 0.232 and T4 1.13 but otherwise unremarkable.  BMP, CBC, troponin and magnesium for the most part within normal limits  EKG, personally viewed and interpreted rapid a flutter with rate of 147  Chest x-ray with no active cardiopulmonary disease  Patient treated with a diltiazem bolus x2 but continued to be tachycardic to the 140s, subsequently placed on a diltiazem infusion.  Hospitalist consulted for admission.  She received a dose of Eliquis 5mg  in the ED.  Review of Systems: As per HPI otherwise all other systems on review of systems negative.    Past Medical History:  Diagnosis Date   Thyroid disease     Past Surgical History:  Procedure Laterality Date   APPENDECTOMY       reports that she has never smoked. She has never used smokeless tobacco. She reports current alcohol use. She reports that she does not use drugs.  Allergies  Allergen Reactions   Amoxicillin Hives    Family History  Problem Relation Age of Onset   Cancer  Mother        Breast   Hypertension Mother    Diabetes Mother    Breast cancer Mother 37   Stroke Father    Hypertension Father    Breast cancer Paternal Grandmother 15      Prior to Admission medications   Medication Sig Start Date End Date Taking? Authorizing Provider  apixaban (ELIQUIS) 5 MG TABS tablet Take 1 tablet (5 mg total) by mouth 2 (two) times daily. 03/26/21 04/25/21 Yes 04/27/21, MD  diltiazem (CARDIZEM CD) 120 MG 24 hr capsule Take 1 capsule (120 mg total) by mouth daily. 03/26/21 04/25/21 Yes 04/27/21, MD  Cholecalciferol (VITAMIN D3) 2000 units TABS Take by mouth.    [provider]  Coenzyme Q10 200 MG capsule Take 200 mg by mouth daily.    [provider]  desonide (DESOWEN) 0.05 % lotion Apply 2 (two) times daily topically. 06/16/17   13/12/18, FNP  fexofenadine (ALLEGRA) 180 MG tablet TAKE 1 TABLET BY MOUTH EVERY DAY 07/03/20   07/05/20, FNP  hydrOXYzine (ATARAX/VISTARIL) 50 MG tablet TAKE 2 TABLETS (100 MG TOTAL) BY MOUTH AT BEDTIME. 09/05/20   11/03/20, FNP  levothyroxine (SYNTHROID) 137 MCG tablet Take 1 tablet (137 mcg total) by mouth daily before breakfast. 01/30/21   02/01/21 B, FNP  Melatonin 5 MG TABS Take by mouth.    [provider]  mometasone (NASONEX) 50 MCG/ACT nasal spray PLACE 2 SPRAYS  INTO THE NOSE DAILY. 10/04/20   Allegra Grana, FNP  Multiple Vitamin (MULTIVITAMIN) tablet Take 1 tablet by mouth daily.    [provider]  Omega-3 Fatty Acids (FISH OIL) 1000 MG CAPS Take by mouth.    [provider]  omeprazole (PRILOSEC) 20 MG capsule TAKE 1 CAPSULE BY MOUTH EVERY DAY 07/03/20   Allegra Grana, FNP    Physical Exam: Vitals:   03/26/21 2103 03/26/21 2108 03/26/21 2130 03/26/21 2200  BP:  127/85 (!) 120/91 131/72  Pulse: 75 82 86 (!) 102  Resp: 17 17  11   Temp:      TempSrc:      SpO2: 97% 97% 97% 97%     Vitals:   03/26/21 2103 03/26/21 2108  03/26/21 2130 03/26/21 2200  BP:  127/85 (!) 120/91 131/72  Pulse: 75 82 86 (!) 102  Resp: 17 17  11   Temp:      TempSrc:      SpO2: 97% 97% 97% 97%      Constitutional: Alert and oriented x 3 . Not in any apparent distress HEENT:      Head: Normocephalic and atraumatic.         Eyes: PERLA, EOMI, Conjunctivae are normal. Sclera is non-icteric.       Mouth/Throat: Mucous membranes are moist.       Neck: Supple with no signs of meningismus. Cardiovascular: Irregularly irregular and tachycardic. No murmurs, gallops, or rubs. 2+ symmetrical distal pulses are present . No JVD. No LE edema Respiratory: Respiratory effort normal .Lungs sounds clear bilaterally. No wheezes, crackles, or rhonchi.  Gastrointestinal: Soft, non tender, and non distended with positive bowel sounds.  Genitourinary: No CVA tenderness. Musculoskeletal: Nontender with normal range of motion in all extremities. No cyanosis, or erythema of extremities. Neurologic:  Face is symmetric. Moving all extremities. No gross focal neurologic deficits . Skin: Skin is warm, dry.  No rash or ulcers Psychiatric: Mood and affect are normal    Labs on Admission: I have personally reviewed following labs and imaging studies  CBC: Recent Labs  Lab 03/26/21 1606  WBC 7.0  HGB 15.4*  HCT 44.5  MCV 96.1  PLT 307   Basic Metabolic Panel: Recent Labs  Lab 03/26/21 1606  NA 139  K 4.6  CL 102  CO2 27  GLUCOSE 103*  BUN 18  CREATININE 0.68  CALCIUM 9.9  MG 2.0   GFR: Estimated Creatinine Clearance: 62.2 mL/min (by C-G formula based on SCr of 0.68 mg/dL). Liver Function Tests: No results for input(s): AST, ALT, ALKPHOS, BILITOT, PROT, ALBUMIN in the last 168 hours. No results for input(s): LIPASE, AMYLASE in the last 168 hours. No results for input(s): AMMONIA in the last 168 hours. Coagulation Profile: No results for input(s): INR, PROTIME in the last 168 hours. Cardiac Enzymes: No results for input(s): CKTOTAL,  CKMB, CKMBINDEX, TROPONINI in the last 168 hours. BNP (last 3 results) No results for input(s): PROBNP in the last 8760 hours. HbA1C: No results for input(s): HGBA1C in the last 72 hours. CBG: No results for input(s): GLUCAP in the last 168 hours. Lipid Profile: No results for input(s): CHOL, HDL, LDLCALC, TRIG, CHOLHDL, LDLDIRECT in the last 72 hours. Thyroid Function Tests: Recent Labs    03/26/21 1606  TSH 0.232*  FREET4 1.13*   Anemia Panel: No results for input(s): VITAMINB12, FOLATE, FERRITIN, TIBC, IRON, RETICCTPCT in the last 72 hours. Urine analysis: No results found for: COLORURINE, APPEARANCEUR, LABSPEC, PHURINE, GLUCOSEU,  HGBUR, BILIRUBINUR, KETONESUR, PROTEINUR, UROBILINOGEN, NITRITE, LEUKOCYTESUR  Radiological Exams on Admission: DG Chest 2 View  Result Date: 03/26/2021 CLINICAL DATA:  Tachycardia. EXAM: CHEST - 2 VIEW COMPARISON:  None. FINDINGS: The heart size and mediastinal contours are within normal limits. Both lungs are clear. The visualized skeletal structures are unremarkable. IMPRESSION: No active cardiopulmonary disease. Electronically Signed   By: Kennith Center M.D.   On: 03/26/2021 13:32     Assessment/Plan 70 year old female with history of hypothyroidism on Synthroid who was sent from her PCPs office for evaluation of new onset A. fib.      New onset Atrial flutter with rapid ventricular response  -Patient presents with heart rate 147 symptomatic for palpitations, in the setting of elevated T4 depressed TSH - CHA2DS2-VASc of 2 for age and sex - Continue diltiazem infusion - Will benefit from systemic anticoagulation for stroke prevention - Lovenox prophylaxis pending further discussion on choice of anticoagulant/risk benefit and insurance coverage  -Echocardiogram in the a.m. - Cardiology consult placed    Hypothyroidism   Low serum thyroid stimulating hormone (TSH) - Overcorrection of TSH, might be contributing to some extent her heart rate -  Hold Synthroid    DVT prophylaxis: Lovenox  Code Status: full code  Family Communication:  none  Disposition Plan: Back to previous home environment Consults called: Cardiology Status:At the time of admission, it appears that the appropriate admission status for this patient is INPATIENT. This is judged to be reasonable and necessary in order to provide the required intensity of service to ensure the patient's safety given the presenting symptoms, physical exam findings, and initial radiographic and laboratory data in the context of their  Comorbid conditions.   Patient requires inpatient status due to high intensity of service, high risk for further deterioration and high frequency of surveillance required.   I certify that at the point of admission it is my clinical judgment that the patient will require inpatient hospital care spanning beyond 2 midnights     Andris Baumann MD [facility]     03/26/2021, 11:31 PM

## 2021-03-27 ENCOUNTER — Encounter: Payer: Self-pay | Admitting: Internal Medicine

## 2021-03-27 ENCOUNTER — Ambulatory Visit: Payer: Medicare HMO | Admitting: Adult Health

## 2021-03-27 ENCOUNTER — Inpatient Hospital Stay
Admit: 2021-03-27 | Discharge: 2021-03-27 | Disposition: A | Discharge status: Home / Self Care | Payer: Medicare HMO | Attending: Internal Medicine | Admitting: Internal Medicine

## 2021-03-27 DIAGNOSIS — I4892 Unspecified atrial flutter: Secondary | ICD-10-CM | POA: Diagnosis not present

## 2021-03-27 LAB — SARS CORONAVIRUS 2 (TAT 6-24 HRS): SARS Coronavirus 2: NEGATIVE

## 2021-03-27 LAB — CREATININE, SERUM
Creatinine, Ser: 0.64 mg/dL (ref 0.44–1.00)
GFR, Estimated: >60 mL/min (ref >60)

## 2021-03-27 LAB — TROPONIN I (HIGH SENSITIVITY): Troponin I (High Sensitivity): 8 ng/L (ref <18)

## 2021-03-27 LAB — HIV ANTIBODY (ROUTINE TESTING W REFLEX): HIV Screen 4th Generation wRfx: NONREACTIVE

## 2021-03-27 MED ORDER — LEVOTHYROXINE SODIUM 137 MCG PO TABS
137.0000 ug | ORAL_TABLET | Freq: Every day | ORAL | Status: DC
  Filled 2021-03-27: qty 1

## 2021-03-27 NOTE — Progress Notes (Signed)
PROGRESS NOTE    Alexis Mcintosh  TML:[id number] DOB: [birth year] DOA: 03/26/2021 PCP: Berniece Pap, FNP   Brief Narrative: Taken from H&P. Alexis Mcintosh is a 70 y.o. female with medical history significant for Hypothyroidism on Synthroid and GERD who was sent from her PCPs office for evaluation of new onset A. fib.  Patient had been having intermittent palpitations for quite some time and was found to be tachycardic in her doctor's office and EKG revealed A. fib with rate of 146 prompting referral to the ED given CHA2DS2-VASc of 2.  Patient denies chest pain, shortness of breath, recent illness.   Found to have low TSH with some improvement as compared to most recent one and mildly elevated T4.  EKG with a flutter and a rate of 147.  Failed to response to initial diltiazem boluses, started on diltiazem infusion.  Cardiology was also consulted.  Subjective: Patient was seen and examined today.  She thinks that she is improving.  Denies any chest pain or shortness of breath.  Having some palpitations.  Per patient her Synthroid dose was increased little while ago after having elevated TSH above 16, her dose was reduced to her original dose recently once her TSH decreased significantly. Patient also has an history of COVID-19 infection in January.  No prior cardiac history.  Vaccinated with [employer].  Assessment & Plan:   Principal Problem:   New onset Atrial flutter with rapid ventricular response (HCC) Active Problems:   Raynaud phenomenon   Hypothyroidism   Low serum thyroid stimulating hormone (TSH)  New onset a flutter with RVR.  No prior cardiac history.  Found to have low TSH with mildly elevated T4 which seems improved from her most recent thyroid labs.CHA2DS2-VASc of 2 for age and sex. Cardiology was consulted-will appreciate their recommendations. -Echocardiogram ordered -Continue with diltiazem infusion.  Hypothyroidism.  Low TSH with mildly elevated T4.  Per patient her  Synthroid dose was increased recently after having high TSH above 16.  Dose was decreased to her original dose after seeing significantly low TSH by PCP.  Holding Synthroid for 1 to 2 days. -Can resume from tomorrow  Objective: Vitals:   03/27/21 1100 03/27/21 1130 03/27/21 1330 03/27/21 1430  BP: 107/79 105/69 102/81 111/76  Pulse: (!) 113 81 100 77  Resp: 17 16 19 17   Temp:      TempSrc:      SpO2: 97% 96% 94% 90%    Intake/Output Summary (Last 24 hours) at 03/27/2021 1509 Last data filed at 03/26/2021 2118 Gross per 24 hour  Intake 500 ml  Output --  Net 500 ml   There were no vitals filed for this visit.  Examination:  General exam: Appears calm and comfortable  Respiratory system: Clear to auscultation. Respiratory effort normal. Cardiovascular system: Irregularly irregular with tachycardia. Gastrointestinal system: Soft, nontender, nondistended, bowel sounds positive. Central nervous system: Alert and oriented. No focal neurological deficits. Extremities: No edema, no cyanosis, pulses intact and symmetrical. Psychiatry: Judgement and insight appear normal. Mood & affect appropriate.    DVT prophylaxis: Lovenox Code Status: Full Family Communication: Discussed with patient. Disposition Plan:  Status is: Inpatient  Remains inpatient appropriate because:Inpatient level of care appropriate due to severity of illness  Dispo: The patient is from: Home              Anticipated d/c is to: Home              Patient currently is not medically stable to d/c.  Difficult to place patient No               Level of care: Progressive Cardiac  All the records are reviewed and case discussed with Care Management/Social Worker. Management plans discussed with the patient, nursing and they are in agreement.  Consultants:  Cardiology  Procedures:  Antimicrobials:   Data Reviewed: I have personally reviewed following labs and imaging studies  CBC: Recent Labs  Lab  03/26/21 1606 03/26/21 2344  WBC 7.0 5.8  HGB 15.4* 13.7  HCT 44.5 40.6  MCV 96.1 93.3  PLT 307 266   Basic Metabolic Panel: Recent Labs  Lab 03/26/21 1606 03/26/21 2344  NA 139  --   K 4.6  --   CL 102  --   CO2 27  --   GLUCOSE 103*  --   BUN 18  --   CREATININE 0.68 0.64  CALCIUM 9.9  --   MG 2.0  --    GFR: Estimated Creatinine Clearance: 62.2 mL/min (by C-G formula based on SCr of 0.64 mg/dL). Liver Function Tests: No results for input(s): AST, ALT, ALKPHOS, BILITOT, PROT, ALBUMIN in the last 168 hours. No results for input(s): LIPASE, AMYLASE in the last 168 hours. No results for input(s): AMMONIA in the last 168 hours. Coagulation Profile: No results for input(s): INR, PROTIME in the last 168 hours. Cardiac Enzymes: No results for input(s): CKTOTAL, CKMB, CKMBINDEX, TROPONINI in the last 168 hours. BNP (last 3 results) No results for input(s): PROBNP in the last 8760 hours. HbA1C: No results for input(s): HGBA1C in the last 72 hours. CBG: No results for input(s): GLUCAP in the last 168 hours. Lipid Profile: No results for input(s): CHOL, HDL, LDLCALC, TRIG, CHOLHDL, LDLDIRECT in the last 72 hours. Thyroid Function Tests: Recent Labs    03/26/21 1606  TSH 0.232*  FREET4 1.13*   Anemia Panel: No results for input(s): VITAMINB12, FOLATE, FERRITIN, TIBC, IRON, RETICCTPCT in the last 72 hours. Sepsis Labs: No results for input(s): PROCALCITON, LATICACIDVEN in the last 168 hours.  No results found for this or any previous visit (from the past 240 hour(s)).   Radiology Studies: DG Chest 2 View  Result Date: 03/26/2021 CLINICAL DATA:  Tachycardia. EXAM: CHEST - 2 VIEW COMPARISON:  None. FINDINGS: The heart size and mediastinal contours are within normal limits. Both lungs are clear. The visualized skeletal structures are unremarkable. IMPRESSION: No active cardiopulmonary disease. Electronically Signed   By: Kennith Center M.D.   On: 03/26/2021 13:32     Scheduled Meds:  enoxaparin (LOVENOX) injection  40 mg Subcutaneous Q24H   Continuous Infusions:  diltiazem (CARDIZEM) infusion 12.5 mg/hr (03/27/21 1054)     LOS: 1 day   Time spent: 40 minutes. More than 50% of the time was spent in counseling/coordination of care  Arnetha Courser, MD [facility]  If 7PM-7AM, please contact night-coverage [email or web address]  03/27/2021, 3:09 PM   This record has been created using Conservation officer, historic buildings. Errors have been sought and corrected,but may not always be located. Such creation errors do not reflect on the standard of care.

## 2021-03-27 NOTE — ED Notes (Signed)
Hospitalist at bedside 

## 2021-03-27 NOTE — Progress Notes (Signed)
Brief cardiac consult note  Impression New onset atrial fibrillation GERD Thyroid disease Borderline obesity Possible obstructive sleep apnea  Plan Agree with overnight observation Transition from intravenous anticoagulation to Eliquis 5 mg twice a day Rate control with Cardizem between 120 and 180 daily Continue omeprazole for reflux type symptoms Agree with echocardiogram for assessment of valvular structures Continue thyroid management and control Have the patient avoid caffeine and other stimulants Safe to discharge home today Follow-up with cardiology upon discharge 1 to 2 weeks

## 2021-03-27 NOTE — Consult Note (Signed)
CARDIOLOGY CONSULT NOTE               Patient ID: Alexis Mcintosh MRN: [id number] DOB/AGE: June 03, 1951 70 y.o.  Admit date: 03/26/2021 Referring Physician Lindajo Royal, MD hospitalist Primary Physician Clarisse Gouge FNP Primary Cardiologist  Reason for Consultation palpitation  HPI: 70 year old female history of hypothyroidism on Synthroid GERD with the PCPs office for lab To the atrial fibrillation having intermittent palpitations and tachycardia.  EKG received atrial fibrillation rate of 156 used for today cardiology in ER because of elevated CHADS2 score.  Denies cough fever chills nausea vomiting or sweats no diarrhea no leg pain or swelling. Patient presents emergency room heart rate of 130 denies any chest pain treated with diltiazem emergency room for tachycardia placed on a drip patient anticoagulation with Eliquis atrial fibrillation atrial flutter rapid ventricular sponsor 150.  Review of systems complete and found to be negative unless listed above     Past Medical History:  Diagnosis Date   Thyroid disease     Past Surgical History:  Procedure Laterality Date   APPENDECTOMY      (Not in a hospital admission)  Social History   Socioeconomic History   Marital status: Married    Spouse name: Not on file   Number of children: Not on file   Years of education: Not on file   Highest education level: Not on file  Occupational History   Not on file  Tobacco Use   Smoking status: Never   Smokeless tobacco: Never  Substance and Sexual Activity   Alcohol use: Yes   Drug use: No   Sexual activity: Never  Other Topics Concern   Not on file  Social History Narrative   Not on file   Social Determinants of Health   Financial Resource Strain: Not on file  Food Insecurity: Not on file  Transportation Needs: Not on file  Physical Activity: Not on file  Stress: Not on file  Social Connections: Not on file  Intimate Partner Violence: Not on file    Family  History  Problem Relation Age of Onset   Cancer Mother        Breast   Hypertension Mother    Diabetes Mother    Breast cancer Mother 34   Stroke Father    Hypertension Father    Breast cancer Paternal Grandmother 22      Review of systems complete and found to be negative unless listed above      PHYSICAL EXAM  General: Well developed, well nourished, in no acute distress HEENT:  Normocephalic and atramatic Neck:  No JVD.  Lungs: Clear bilaterally to auscultation and percussion. Heart: HRRR . Normal S1 and S2 without gallops or murmurs.  Abdomen: Bowel sounds are positive, abdomen soft and non-tender  Msk:  Back normal, normal gait. Normal strength and tone for age. Extremities: No clubbing, cyanosis or edema.   Neuro: Alert and oriented X 3. Psych:  Good affect, responds appropriately  Labs:   Lab Results  Component Value Date   WBC 5.8 03/26/2021   HGB 13.7 03/26/2021   HCT 40.6 03/26/2021   MCV 93.3 03/26/2021   PLT 266 03/26/2021    Recent Labs  Lab 03/26/21 1606 03/26/21 2344  NA 139  --   K 4.6  --   CL 102  --   CO2 27  --   BUN 18  --   CREATININE 0.68 0.64  CALCIUM 9.9  --   GLUCOSE 103*  --  No results found for: CKTOTAL, CKMB, CKMBINDEX, TROPONINI  Lab Results  Component Value Date   CHOL 209 (H) 05/18/2019   Lab Results  Component Value Date   HDL 84.50 05/18/2019   Lab Results  Component Value Date   LDLCALC 111 (H) 05/18/2019   Lab Results  Component Value Date   TRIG 65.0 05/18/2019   Lab Results  Component Value Date   CHOLHDL 2 05/18/2019   No results found for: LDLDIRECT    Radiology: DG Chest 2 View  Result Date: 03/26/2021 CLINICAL DATA:  Tachycardia. EXAM: CHEST - 2 VIEW COMPARISON:  None. FINDINGS: The heart size and mediastinal contours are within normal limits. Both lungs are clear. The visualized skeletal structures are unremarkable. IMPRESSION: No active cardiopulmonary disease. Electronically Signed   By:  Kennith Center M.D.   On: 03/26/2021 13:32    EKG: EKG atrial flutter rate of 150 nonspecific ST-T wave changes  ASSESSMENT AND PLAN:  Atrial fibrillation no Hypothyroidism Tachycardia Borderline obesity Abnormal EKG . Plan Agree with admit to telemetry for rate control Rule out myocardial infarction follow-up EKGs Start anticoagulation therapy for atrial fibrillation because of significant CHADS2 score Diltiazem therapy for palpitation and tachycardia Agree with echocardiogram for assessment of valvular structures Follow-up thyroid studies as this may be contributing to atrial fibrillation  Signed: Alwyn Pea MD  03/27/2021, 8:32 AM

## 2021-03-27 NOTE — ED Notes (Signed)
Cardiology at bedside.

## 2021-03-27 NOTE — ED Notes (Signed)
Pt made aware of ECHO order and need to stay.  Verbalized understanding.

## 2021-03-28 ENCOUNTER — Encounter: Payer: Self-pay | Admitting: Internal Medicine

## 2021-03-28 DIAGNOSIS — I4892 Unspecified atrial flutter: Secondary | ICD-10-CM | POA: Diagnosis not present

## 2021-03-28 LAB — ECHOCARDIOGRAM COMPLETE
Height: 62 in
P 1/2 time: 458 msec
S' Lateral: 3.15 cm
Weight: 2438.4 oz

## 2021-03-28 MED ORDER — APIXABAN 5 MG PO TABS
5.0000 mg | ORAL_TABLET | Freq: Two times a day (BID) | ORAL | Status: DC
  Administered 2021-03-28: 5 mg via ORAL
  Filled 2021-03-28: qty 1

## 2021-03-28 MED ORDER — DILTIAZEM HCL ER COATED BEADS 180 MG PO CP24
180.0000 mg | ORAL_CAPSULE | Freq: Every day | ORAL | Status: DC
  Administered 2021-03-28: 180 mg via ORAL
  Filled 2021-03-28: qty 1

## 2021-03-28 NOTE — Progress Notes (Signed)
[facility]    SUBJECTIVE: Patient feels reasonably well no chest pain no palpitation no tachycardia heart rate slightly still irregular no previous history of A. fib ready to be discharged home agreeable to follow-up as an outpatient denies any bleeding issues   Vitals:   03/27/21 1530 03/27/21 1603 03/27/21 1955 03/28/21 0758  BP: 109/89 118/74 114/75 121/80  Pulse: 91 82 84 82  Resp: 14 18 16 16   Temp:  98.5 F (36.9 C) 98.3 F (36.8 C) 98 F (36.7 C)  TempSrc:   Oral   SpO2: 96% 96% 95% 95%     Intake/Output Summary (Last 24 hours) at 03/28/2021 03/30/2021 Last data filed at 03/28/2021 0759 Gross per 24 hour  Intake 923.32 ml  Output --  Net 923.32 ml      PHYSICAL EXAM  General: Well developed, well nourished, in no acute distress HEENT:  Normocephalic and atramatic Neck:  No JVD.  Lungs: Clear bilaterally to auscultation and percussion. Heart: Irregular. Normal S1 and S2 without gallops or murmurs.  Abdomen: Bowel sounds are positive, abdomen soft and non-tender  Msk:  Back normal, normal gait. Normal strength and tone for age. Extremities: No clubbing, cyanosis or edema.   Neuro: Alert and oriented X 3. Psych:  Good affect, responds appropriately   LABS: Basic Metabolic Panel: Recent Labs    03/26/21 1606 03/26/21 2344  NA 139  --   K 4.6  --   CL 102  --   CO2 27  --   GLUCOSE 103*  --   BUN 18  --   CREATININE 0.68 0.64  CALCIUM 9.9  --   MG 2.0  --    Liver Function Tests: No results for input(s): AST, ALT, ALKPHOS, BILITOT, PROT, ALBUMIN in the last 72 hours. No results for input(s): LIPASE, AMYLASE in the last 72 hours. CBC: Recent Labs    03/26/21 1606 03/26/21 2344  WBC 7.0 5.8  HGB 15.4* 13.7  HCT 44.5 40.6  MCV 96.1 93.3  PLT 307 266   Cardiac Enzymes: No results for input(s): CKTOTAL, CKMB, CKMBINDEX, TROPONINI in the last 72 hours. BNP: Invalid input(s): POCBNP D-Dimer: No results for input(s): DDIMER in the last 72  hours. Hemoglobin A1C: No results for input(s): HGBA1C in the last 72 hours. Fasting Lipid Panel: No results for input(s): CHOL, HDL, LDLCALC, TRIG, CHOLHDL, LDLDIRECT in the last 72 hours. Thyroid Function Tests: Recent Labs    03/26/21 1606  TSH 0.232*   Anemia Panel: No results for input(s): VITAMINB12, FOLATE, FERRITIN, TIBC, IRON, RETICCTPCT in the last 72 hours.  DG Chest 2 View  Result Date: 03/26/2021 CLINICAL DATA:  Tachycardia. EXAM: CHEST - 2 VIEW COMPARISON:  None. FINDINGS: The heart size and mediastinal contours are within normal limits. Both lungs are clear. The visualized skeletal structures are unremarkable. IMPRESSION: No active cardiopulmonary disease. Electronically Signed   By: 03/28/2021 M.D.   On: 03/26/2021 13:32   ECHOCARDIOGRAM COMPLETE  Result Date: 03/28/2021    ECHOCARDIOGRAM REPORT   Patient Name:   Alexis Mcintosh Date of Exam: 03/27/2021 Medical Rec #:  03/29/2021     Height:       62.0 in Accession #:    [id number]    Weight:       152.4 lb Date of Birth:  [birth year]     BSA:          1.703 m Patient Age:    70 years      BP:  118/74 mmHg Patient Gender: F             HR:           89 bpm. Exam Location:  [facility] Procedure: 2D Echo, Cardiac Doppler and Color Doppler Indications:     I48.92 Atrial Flutter  History:         Patient has no prior history of Echocardiogram examinations.                  Thyroid disease.  Sonographer:     Daphine Deutscher RDCS Referring Phys:  [id number] XNATFTD AMIN Diagnosing Phys: Alwyn Pea MD IMPRESSIONS  1. Normal LVF.  2. Normal Wall Motion.  3. Left ventricular ejection fraction, by estimation, is 60 to 65%. The left ventricle has normal function. The left ventricle has no regional wall motion abnormalities. Left ventricular diastolic parameters were normal.  4. Right ventricular systolic function is normal. The right ventricular size is normal.  5. The mitral valve is normal in structure. Trivial mitral valve  regurgitation.  6. The aortic valve is normal in structure. Aortic valve regurgitation is mild. FINDINGS  Left Ventricle: Left ventricular ejection fraction, by estimation, is 60 to 65%. The left ventricle has normal function. The left ventricle has no regional wall motion abnormalities. The left ventricular internal cavity size was normal in size. There is  no left ventricular hypertrophy. Left ventricular diastolic parameters were normal. Right Ventricle: The right ventricular size is normal. No increase in right ventricular wall thickness. Right ventricular systolic function is normal. Left Atrium: Left atrial size was normal in size. Right Atrium: Right atrial size was normal in size. Pericardium: There is no evidence of pericardial effusion. Mitral Valve: The mitral valve is normal in structure. Trivial mitral valve regurgitation. Tricuspid Valve: The tricuspid valve is normal in structure. Tricuspid valve regurgitation is trivial. Aortic Valve: The aortic valve is normal in structure. Aortic valve regurgitation is mild. Aortic regurgitation PHT measures 458 msec. Pulmonic Valve: The pulmonic valve was normal in structure. Pulmonic valve regurgitation is not visualized. Aorta: The ascending aorta was not well visualized. IAS/Shunts: No atrial level shunt detected by color flow Doppler. Additional Comments: Normal LVF. Normal Wall Motion.  LEFT VENTRICLE PLAX 2D LVIDd:         4.90 cm LVIDs:         3.15 cm LV PW:         0.74 cm LV IVS:        0.65 cm LVOT diam:     1.80 cm LV SV:         36 LV SV Index:   21 LVOT Area:     2.54 cm  RIGHT VENTRICLE             IVC RV Basal diam:  2.76 cm     IVC diam: 1.75 cm RV S prime:     11.00 cm/s TAPSE (M-mode): 1.2 cm LEFT ATRIUM             Index       RIGHT ATRIUM           Index LA diam:        4.90 cm 2.88 cm/m  RA Area:     12.90 cm LA Vol (A2C):   37.3 ml 21.90 ml/m RA Volume:   31.90 ml  18.73 ml/m LA Vol (A4C):   42.1 ml 24.72 ml/m LA Biplane Vol: 40.3 ml  23.66 ml/m  AORTIC VALVE LVOT  Vmax:   88.68 cm/s LVOT Vmean:  61.150 cm/s LVOT VTI:    0.143 m AI PHT:      458 msec  AORTA Ao Root diam: 3.10 cm Ao Asc diam:  3.40 cm MV E velocity: 97.08 cm/s  TRICUSPID VALVE                            TR Peak grad:   25.4 mmHg                            TR Vmax:        252.00 cm/s                             SHUNTS                            Systemic VTI:  0.14 m                            Systemic Diam: 1.80 cm Karrigan Messamore D Laird Runnion MD Electronically signed by Alwyn Pea MD Signature Date/Time: 03/28/2021/7:55:24 AM    Final      Echo preserved overall left ventricular function around 60%  TELEMETRY: Atrial fibrillation rate of around 80:  ASSESSMENT AND PLAN:  Principal Problem:   New onset Atrial flutter with rapid ventricular response (HCC) Active Problems:   Raynaud phenomenon   Hypothyroidism   Low serum thyroid stimulating hormone (TSH) Borderline obesity  Plan Transition to p.o. diltiazem for rate control for A. Fib Anticoagulation to Eliquis 5 mg twice a day Discontinue nifedipine since patient on diltiazem which should help with Raynaud's Continue thyroid management and control Recommend modest weight loss exercise portion control Consider outpatient functional study Hopefully recommend discharge home today follow-up with cardiology 1 to 2 weeks   Alwyn Pea, MD 03/28/2021 8:23 AM

## 2021-03-28 NOTE — Progress Notes (Signed)
Patient is still in the hospital and a follow up will be done after she gets out.  Maille Halliwell,cma

## 2021-03-28 NOTE — Discharge Summary (Signed)
Physician Discharge Summary  Namita Yearwood NTI:[id number] DOB: 23-Aug-1950 DOA: 03/26/2021  PCP: Berniece Pap, FNP  Admit date: 03/26/2021 Discharge date: 03/28/2021  Admitted From: Home Disposition:  Home  Recommendations for Outpatient Follow-up:  Follow up with PCP in 1-2 weeks Follow up with cardiogy in 1-2 weeks. Please obtain BMP/CBC in one week Please follow up on the following pending results:None  Home Health:No Equipment/Devices:None Discharge Condition: Stable CODE STATUS: Full Diet recommendation: Heart Healthy   Brief/Interim Summary:  Alexis Mcintosh is a 70 y.o. female with medical history significant for Hypothyroidism on Synthroid and GERD who was sent from her PCPs office for evaluation of new onset A. fib.  Patient had been having intermittent palpitations for quite some time and was found to be tachycardic in her doctor's office and EKG revealed A. fib with rate of 146 prompting referral to the ED given CHA2DS2-VASc of 2.  Patient denies chest pain, shortness of breath, recent illness.   Cardiology was consulted for new onset A. fib.  Initially received diltiazem boluses x2 but continue to remain tachycardic, later placed on diltiazem infusion with improvement in heart rate. Dr. Juliann Pares from cardiology saw her and recommended starting p.o. Cardizem at 120 mg daily which will need titration by her cardiologist.  She was also started on Eliquis.  She will need a close cardiology follow-up.  Patient wants to go to a different cardiologist and according to her preference she was provided with information about [facility]. Her home dose of nifedipine was discontinued.  Patient was also found to have low TSH with mildly increased free T4 which can be contributing to her A. fib.  Apparently her home dose of Synthroid was initially increased after having a high TSH and recently decreased to her original dose after seeing significantly low TSH.  Her TSH started improving  after decreasing the dose.  We held Synthroid for 2 days and she can resume her home dose and will need a close follow-up for further titration of her dose.  She will continue rest of her home medications and follow-up with her providers.  Discharge Diagnoses:  Principal Problem:   New onset Atrial flutter with rapid ventricular response (HCC) Active Problems:   Raynaud phenomenon   Hypothyroidism   Low serum thyroid stimulating hormone (TSH)   Discharge Instructions  Discharge Instructions     Amb referral to [facility] Clinic   Complete by: As directed    Diet - low sodium heart healthy   Complete by: As directed    Discharge instructions   Complete by: As directed    It was pleasure taking care of you. Your cardiologist started you on Cardizem 120 mg daily, please start taking it as directed and stop taking nifedipine which you were taking before for your blood pressure. You are also being started on a blood thinner called Eliquis. Avoid extra caffeinated drinks or stannous exercises which can increase your heart rate and follow-up with your cardiologist in 1 to 2 weeks for further recommendations.   Increase activity slowly   Complete by: As directed       Allergies as of 03/28/2021       Reactions   Amoxicillin Hives        Medication List     STOP taking these medications    NIFEdipine 30 MG 24 hr tablet Commonly known as: ADALAT CC       TAKE these medications    apixaban 5 MG Tabs tablet Commonly known  asEverlene Balls Take 1 tablet (5 mg total) by mouth 2 (two) times daily.   Coenzyme Q10 200 MG capsule Take 200 mg by mouth daily.   desonide 0.05 % lotion Commonly known as: DESOWEN Apply 2 (two) times daily topically.   diltiazem 120 MG 24 hr capsule Commonly known as: Cardizem CD Take 1 capsule (120 mg total) by mouth daily.   fexofenadine 180 MG tablet Commonly known as: ALLEGRA TAKE 1 TABLET BY MOUTH EVERY DAY   Fish Oil 1000 MG Caps Take by  mouth.   hydrOXYzine 50 MG tablet Commonly known as: ATARAX/VISTARIL TAKE 2 TABLETS (100 MG TOTAL) BY MOUTH AT BEDTIME.   levothyroxine 137 MCG tablet Commonly known as: Synthroid Take 1 tablet (137 mcg total) by mouth daily before breakfast.   melatonin 5 MG Tabs Take by mouth.   mometasone 50 MCG/ACT nasal spray Commonly known as: NASONEX PLACE 2 SPRAYS INTO THE NOSE DAILY.   multivitamin tablet Take 1 tablet by mouth daily.   omeprazole 20 MG capsule Commonly known as: PRILOSEC TAKE 1 CAPSULE BY MOUTH EVERY DAY   Vitamin D3 50 MCG (2000 UT) Tabs Take by mouth.        Follow-up Information     Flinchum, Eula Fried, FNP .   Specialty: Family Medicine Why: Call to discuss your Er visit and to arrange follow-up THIS WEEK Contact information: [address] Laurell Josephs [address] [place] [address] [phone]         Tedd Sias Marlana Salvage, MD .   Specialty: Endocrinology Why: Call to set up an appointment with an Endocrinologist in the next several weeks Contact information: [address] [facility] [place] [address] [phone]         Yvonne Kendall, MD. Schedule an appointment as soon as possible for a visit in 1 week(s).   Specialty: Cardiology Contact information: [address] [place] [address] [phone]                Allergies  Allergen Reactions   Amoxicillin Hives    Consultations: Cardiology  Procedures/Studies: DG Chest 2 View  Result Date: 03/26/2021 CLINICAL DATA:  Tachycardia. EXAM: CHEST - 2 VIEW COMPARISON:  None. FINDINGS: The heart size and mediastinal contours are within normal limits. Both lungs are clear. The visualized skeletal structures are unremarkable. IMPRESSION: No active cardiopulmonary disease. Electronically Signed   By: Kennith Center M.D.   On: 03/26/2021 13:32   ECHOCARDIOGRAM COMPLETE  Result Date: 03/28/2021    ECHOCARDIOGRAM REPORT   Patient Name:   Alexis Mcintosh  Date of Exam: 03/27/2021 Medical Rec #:  [id number]     Height:       62.0 in Accession #:    [id number]    Weight:       152.4 lb Date of Birth:  15-Jan-1951     BSA:          1.703 m Patient Age:    70 years      BP:           118/74 mmHg Patient Gender: F             HR:           89 bpm. Exam Location:  [facility] Procedure: 2D Echo, Cardiac Doppler and Color Doppler Indications:     I48.92 Atrial Flutter  History:         Patient has no prior history of Echocardiogram examinations.  Thyroid disease.  Sonographer:     Daphine Deutscher RDCS Referring Phys:  [id number] RSWNIOE Lashay Osborne Diagnosing Phys: Alwyn Pea MD IMPRESSIONS  1. Normal LVF.  2. Normal Wall Motion.  3. Left ventricular ejection fraction, by estimation, is 60 to 65%. The left ventricle has normal function. The left ventricle has no regional wall motion abnormalities. Left ventricular diastolic parameters were normal.  4. Right ventricular systolic function is normal. The right ventricular size is normal.  5. The mitral valve is normal in structure. Trivial mitral valve regurgitation.  6. The aortic valve is normal in structure. Aortic valve regurgitation is mild. FINDINGS  Left Ventricle: Left ventricular ejection fraction, by estimation, is 60 to 65%. The left ventricle has normal function. The left ventricle has no regional wall motion abnormalities. The left ventricular internal cavity size was normal in size. There is  no left ventricular hypertrophy. Left ventricular diastolic parameters were normal. Right Ventricle: The right ventricular size is normal. No increase in right ventricular wall thickness. Right ventricular systolic function is normal. Left Atrium: Left atrial size was normal in size. Right Atrium: Right atrial size was normal in size. Pericardium: There is no evidence of pericardial effusion. Mitral Valve: The mitral valve is normal in structure. Trivial mitral valve regurgitation. Tricuspid Valve: The tricuspid  valve is normal in structure. Tricuspid valve regurgitation is trivial. Aortic Valve: The aortic valve is normal in structure. Aortic valve regurgitation is mild. Aortic regurgitation PHT measures 458 msec. Pulmonic Valve: The pulmonic valve was normal in structure. Pulmonic valve regurgitation is not visualized. Aorta: The ascending aorta was not well visualized. IAS/Shunts: No atrial level shunt detected by color flow Doppler. Additional Comments: Normal LVF. Normal Wall Motion.  LEFT VENTRICLE PLAX 2D LVIDd:         4.90 cm LVIDs:         3.15 cm LV PW:         0.74 cm LV IVS:        0.65 cm LVOT diam:     1.80 cm LV SV:         36 LV SV Index:   21 LVOT Area:     2.54 cm  RIGHT VENTRICLE             IVC RV Basal diam:  2.76 cm     IVC diam: 1.75 cm RV S prime:     11.00 cm/s TAPSE (M-mode): 1.2 cm LEFT ATRIUM             Index       RIGHT ATRIUM           Index LA diam:        4.90 cm 2.88 cm/m  RA Area:     12.90 cm LA Vol (A2C):   37.3 ml 21.90 ml/m RA Volume:   31.90 ml  18.73 ml/m LA Vol (A4C):   42.1 ml 24.72 ml/m LA Biplane Vol: 40.3 ml 23.66 ml/m  AORTIC VALVE LVOT Vmax:   88.68 cm/s LVOT Vmean:  61.150 cm/s LVOT VTI:    0.143 m AI PHT:      458 msec  AORTA Ao Root diam: 3.10 cm Ao Asc diam:  3.40 cm MV E velocity: 97.08 cm/s  TRICUSPID VALVE                            TR Peak grad:   25.4 mmHg  TR Vmax:        252.00 cm/s                             SHUNTS                            Systemic VTI:  0.14 m                            Systemic Diam: 1.80 cm Alwyn Pea MD Electronically signed by Alwyn Pea MD Signature Date/Time: 03/28/2021/7:55:24 AM    Final     Subjective: Patient was seen and examined today.  Denies any complaints.  She wants to go home.  She wants to see a different cardiologist and asking for options.  Discharge Exam: Vitals:   03/27/21 1955 03/28/21 0758  BP: 114/75 121/80  Pulse: 84 82  Resp: 16 16  Temp: 98.3 F (36.8 C) 98  F (36.7 C)  SpO2: 95% 95%   Vitals:   03/27/21 1530 03/27/21 1603 03/27/21 1955 03/28/21 0758  BP: 109/89 118/74 114/75 121/80  Pulse: 91 82 84 82  Resp: 14 18 16 16   Temp:  98.5 F (36.9 C) 98.3 F (36.8 C) 98 F (36.7 C)  TempSrc:   Oral   SpO2: 96% 96% 95% 95%    General: Pt is alert, awake, not in acute distress Cardiovascular: Irregularly irregular, no rubs, no gallops Respiratory: CTA bilaterally, no wheezing, no rhonchi Abdominal: Soft, NT, ND, bowel sounds + Extremities: no edema, no cyanosis   The results of significant diagnostics from this hospitalization (including imaging, microbiology, ancillary and laboratory) are listed below for reference.    Microbiology: Recent Results (from the past 240 hour(s))  SARS CORONAVIRUS 2 (TAT 6-24 HRS) Nasopharyngeal Nasopharyngeal Swab     Status: None   Collection Time: 03/27/21  9:08 AM   Specimen: Nasopharyngeal Swab  Result Value Ref Range Status   SARS Coronavirus 2 NEGATIVE NEGATIVE Final    Comment: (NOTE) SARS-CoV-2 target nucleic acids are NOT DETECTED.  The SARS-CoV-2 RNA is generally detectable in upper and lower respiratory specimens during the acute phase of infection. Negative results do not preclude SARS-CoV-2 infection, do not rule out co-infections with other pathogens, and should not be used as the sole basis for treatment or other patient management decisions. Negative results must be combined with clinical observations, patient history, and epidemiological information. The expected result is Negative.  Fact Sheet for Patients: 03/29/21  Fact Sheet for Healthcare Providers: HairSlick.no  This test is not yet approved or cleared by the [place] FDA and  has been authorized for detection and/or diagnosis of SARS-CoV-2 by FDA under an Emergency Use Authorization (EUA). This EUA will remain  in effect (meaning this test can be used)  for the duration of the COVID-19 declaration under Se ction 564(b)(1) of the Act, 21 U.S.C. section 360bbb-3(b)(1), unless the authorization is terminated or revoked sooner.  Performed at [facility], [address], [place], [place]      Labs: BNP (last 3 results) No results for input(s): BNP in the last 8760 hours. Basic Metabolic Panel: Recent Labs  Lab 03/26/21 1606 03/26/21 2344  NA 139  --   K 4.6  --   CL 102  --   CO2 27  --   GLUCOSE  103*  --   BUN 18  --   CREATININE 0.68 0.64  CALCIUM 9.9  --   MG 2.0  --    Liver Function Tests: No results for input(s): AST, ALT, ALKPHOS, BILITOT, PROT, ALBUMIN in the last 168 hours. No results for input(s): LIPASE, AMYLASE in the last 168 hours. No results for input(s): AMMONIA in the last 168 hours. CBC: Recent Labs  Lab 03/26/21 1606 03/26/21 2344  WBC 7.0 5.8  HGB 15.4* 13.7  HCT 44.5 40.6  MCV 96.1 93.3  PLT 307 266   Cardiac Enzymes: No results for input(s): CKTOTAL, CKMB, CKMBINDEX, TROPONINI in the last 168 hours. BNP: Invalid input(s): POCBNP CBG: No results for input(s): GLUCAP in the last 168 hours. D-Dimer No results for input(s): DDIMER in the last 72 hours. Hgb A1c No results for input(s): HGBA1C in the last 72 hours. Lipid Profile No results for input(s): CHOL, HDL, LDLCALC, TRIG, CHOLHDL, LDLDIRECT in the last 72 hours. Thyroid function studies Recent Labs    03/26/21 1606  TSH 0.232*   Anemia work up No results for input(s): VITAMINB12, FOLATE, FERRITIN, TIBC, IRON, RETICCTPCT in the last 72 hours. Urinalysis No results found for: COLORURINE, APPEARANCEUR, LABSPEC, PHURINE, GLUCOSEU, HGBUR, BILIRUBINUR, KETONESUR, PROTEINUR, UROBILINOGEN, NITRITE, LEUKOCYTESUR Sepsis Labs Invalid input(s): PROCALCITONIN,  WBC,  LACTICIDVEN Microbiology Recent Results (from the past 240 hour(s))  SARS CORONAVIRUS 2 (TAT 6-24 HRS) Nasopharyngeal Nasopharyngeal Swab     Status: None    Collection Time: 03/27/21  9:08 AM   Specimen: Nasopharyngeal Swab  Result Value Ref Range Status   SARS Coronavirus 2 NEGATIVE NEGATIVE Final    Comment: (NOTE) SARS-CoV-2 target nucleic acids are NOT DETECTED.  The SARS-CoV-2 RNA is generally detectable in upper and lower respiratory specimens during the acute phase of infection. Negative results do not preclude SARS-CoV-2 infection, do not rule out co-infections with other pathogens, and should not be used as the sole basis for treatment or other patient management decisions. Negative results must be combined with clinical observations, patient history, and epidemiological information. The expected result is Negative.  Fact Sheet for Patients: HairSlick.nohttps://www.fda.gov/media/138098/download  Fact Sheet for Healthcare Providers: quierodirigir.comhttps://www.fda.gov/media/138095/download  This test is not yet approved or cleared by the [place] FDA and  has been authorized for detection and/or diagnosis of SARS-CoV-2 by FDA under an Emergency Use Authorization (EUA). This EUA will remain  in effect (meaning this test can be used) for the duration of the COVID-19 declaration under Se ction 564(b)(1) of the Act, 21 U.S.C. section 360bbb-3(b)(1), unless the authorization is terminated or revoked sooner.  Performed at [facility], [address] [place], [place] [id number]     Time coordinating discharge: Over 30 minutes  SIGNED:  Arnetha CourserSumayya Kiwan Gadsden, MD  [facility] 03/28/2021, 9:40 AM  If 7PM-7AM, please contact night-coverage [email or web address]  This record has been created using Conservation officer, historic buildingsDragon voice recognition software. Errors have been sought and corrected,but may not always be located. Such creation errors do not reflect on the standard of care.

## 2021-03-28 NOTE — Progress Notes (Signed)
D/C cardizem gtt at 0900 and started on PO cardizem.  HR remains in afib around 80-90bpm.  Ambulated patient halfway around nursing station at 1045 with HR sustaining around 120-130bpm with max HR up to 155 with ambulation.  Patient reports no SOB or CP.  Patient escorted back to her room/bed and HR remains slightly elevated sustaining in the 110's.   Dr. Nelson Chimes contacted d/t pending discharge and received verbal order to continue monitoring patient awhile longer to see how HR trends.  Will ambulate after lunch and if she does well, she will discharge home.  If not, she may stay in the hospital for more monitoring.

## 2021-03-28 NOTE — Progress Notes (Signed)
Patient ambulated again and HR sustained in the 110-115.  MD aware and agreeable to discharge.  Patient to f/u with pcp and cardiology.  She is able to repeat back information about discharge.  D/c telemetry and piv.  Escorted out of hospital via wheelchair by volunteers.

## 2021-03-28 NOTE — Progress Notes (Signed)
ANTICOAGULATION CONSULT NOTE  Pharmacy Consult for Apixaban Indication: atrial fibrillation  Allergies  Allergen Reactions   Amoxicillin Hives    Vital Signs: Temp: 98 F (36.7 C) (08/24 0758) BP: 121/80 (08/24 0758) Pulse Rate: 82 (08/24 0758)  Labs: Recent Labs    03/26/21 1606 03/26/21 2344 03/26/21 2346  HGB 15.4* 13.7  --   HCT 44.5 40.6  --   PLT 307 266  --   CREATININE 0.68 0.64  --   TROPONINIHS 7  --  8    Estimated Creatinine Clearance: 62.2 mL/min (by C-G formula based on SCr of 0.64 mg/dL).   Medical History: Past Medical History:  Diagnosis Date   Thyroid disease     Assessment: 70 y.o. female with medical history significant for Hypothyroidism on Synthroid and GERD who was sent from her PCPs office for evaluation of new onset A. Fib. Pharmacy has been consulted for apixaban dosing.   Goal of Therapy:  Monitor platelets by anticoagulation protocol: Yes   Plan:  Pt does not meet criteria for reduced dose. Will start Apixaban 5 mg BID Monitor CBC per protocol   Arwin Bisceglia O Adair Lemar 03/28/2021,10:40 AM

## 2021-03-30 ENCOUNTER — Telehealth: Payer: Self-pay

## 2021-03-30 NOTE — Telephone Encounter (Signed)
Transition Care Management Unsuccessful Follow-up Telephone Call  Date of discharge and from where:  03/28/2021-[facility]  Attempts:  2nd Attempt  Reason for unsuccessful TCM follow-up call:  No answer/busy

## 2021-04-02 ENCOUNTER — Telehealth: Payer: Self-pay | Admitting: Adult Health

## 2021-04-02 ENCOUNTER — Telehealth: Payer: Self-pay | Admitting: Family Medicine

## 2021-04-02 NOTE — Telephone Encounter (Signed)
Transition Care Management Unsuccessful Follow-up Telephone Call  Date of discharge and from where:  03/28/21 from [facility]  Attempts:  3rd Attempt  Reason for unsuccessful TCM follow-up call:  Left voice message

## 2021-04-02 NOTE — Telephone Encounter (Signed)
Patient was recently in the hospital. She is set to follow up with Margaret Arnett.   Patient states she was informed by dr Sonnenberg to see him specifically for her hospital follow up. Patient states she wants to see hm instead. Informed the Patient that Dr Sonnenberg does not have appointments until the end of September.   Patient was upset, states every time she tries to schedule an appointment there is a problem. States she will probably start to look for another doctors office. She states to keep the appointment with Margaret but see if Dr Sonnenberg will fit her in.   Please advise on fitting Patient in.  

## 2021-04-02 NOTE — Telephone Encounter (Signed)
Patient saw Dr.Sonnenberg on 8/22 and was sent to the ER at this visit.She was discharged from the hospital last Wednesday and was informed that she is to see Dr.Sonnenberg 1-2 weeks from her discharge date.She is scheduled with Rennie Plowman but was told by Dr.Sonnberg at her visit she has to see him,please advise.

## 2021-04-02 NOTE — Telephone Encounter (Signed)
There is an opening at 4 PM on Thursday 9/1.  If that gets filled she can be seen at 1230 Thursday.

## 2021-04-02 NOTE — Telephone Encounter (Signed)
LVM for patient to call back.   Dhalia Zingaro,cma  

## 2021-04-02 NOTE — Telephone Encounter (Signed)
Patient was recently in the hospital. She is set to follow up with Rennie Plowman.   Patient states she was informed by dr Birdie Sons to see him specifically for her hospital follow up. Patient states she wants to see hm instead. Informed the Patient that Dr Birdie Sons does not have appointments until the end of September.   Patient was upset, states every time she tries to schedule an appointment there is a problem. States she will probably start to look for another doctors office. She states to keep the appointment with Claris Che but see if Dr Birdie Sons will fit her in.   Please advise on fitting Patient in.

## 2021-04-04 NOTE — Telephone Encounter (Signed)
I called the patient and received a busy signal.  Alexis Mcintosh,cma

## 2021-04-05 NOTE — Telephone Encounter (Signed)
I made three attempts to reach the patient to put her in a psot the provider saved for her but I could not reach the patient, she is seeing M. Arnette on September 6.  Klever Twyford,cma

## 2021-04-06 ENCOUNTER — Telehealth: Payer: Self-pay

## 2021-04-06 NOTE — Telephone Encounter (Signed)
LMTCB

## 2021-04-06 NOTE — Telephone Encounter (Signed)
Pt called and states that she wants a referral from [facility] to see Dr Tedd Sias in Endo and wants a call back today to clarify. She has a HFU on Tuesday but did not want to wait until then for an answer. Please advise

## 2021-04-10 ENCOUNTER — Other Ambulatory Visit: Payer: Self-pay

## 2021-04-10 ENCOUNTER — Ambulatory Visit (INDEPENDENT_AMBULATORY_CARE_PROVIDER_SITE_OTHER): Payer: Medicare HMO | Admitting: Family

## 2021-04-10 VITALS — BP 103/72 | HR 77 | Temp 97.8°F | Resp 12 | Ht 62.0 in | Wt 149.4 lb

## 2021-04-10 DIAGNOSIS — L509 Urticaria, unspecified: Secondary | ICD-10-CM

## 2021-04-10 DIAGNOSIS — L309 Dermatitis, unspecified: Secondary | ICD-10-CM | POA: Diagnosis not present

## 2021-04-10 DIAGNOSIS — I4892 Unspecified atrial flutter: Secondary | ICD-10-CM

## 2021-04-10 DIAGNOSIS — J302 Other seasonal allergic rhinitis: Secondary | ICD-10-CM | POA: Diagnosis not present

## 2021-04-10 DIAGNOSIS — K219 Gastro-esophageal reflux disease without esophagitis: Secondary | ICD-10-CM

## 2021-04-10 DIAGNOSIS — E039 Hypothyroidism, unspecified: Secondary | ICD-10-CM | POA: Diagnosis not present

## 2021-04-10 MED ORDER — FEXOFENADINE HCL 180 MG PO TABS
180.0000 mg | ORAL_TABLET | Freq: Every day | ORAL | 3 refills | Status: AC
Start: 2021-04-10 — End: ?

## 2021-04-10 MED ORDER — DILTIAZEM HCL ER COATED BEADS 120 MG PO CP24: 120.0000 mg | ORAL_CAPSULE | Freq: Every day | ORAL | 1 refills | Status: DC

## 2021-04-10 MED ORDER — OMEPRAZOLE 20 MG PO CPDR: 20.0000 mg | DELAYED_RELEASE_CAPSULE | ORAL | 1 refills | Status: DC

## 2021-04-10 MED ORDER — DESONIDE 0.05 % EX LOTN: TOPICAL_LOTION | Freq: Two times a day (BID) | CUTANEOUS | 0 refills | Status: DC

## 2021-04-10 MED ORDER — LEVOTHYROXINE SODIUM 125 MCG PO TABS: 125.0000 ug | ORAL_TABLET | Freq: Every day | ORAL | 1 refills | Status: DC

## 2021-04-10 MED ORDER — MOMETASONE FUROATE 50 MCG/ACT NA SUSP: 2.0000 | Freq: Every day | NASAL | 3 refills | Status: DC

## 2021-04-10 MED ORDER — APIXABAN 5 MG PO TABS: 5.0000 mg | ORAL_TABLET | Freq: Two times a day (BID) | ORAL | 1 refills | Status: DC

## 2021-04-10 MED ORDER — HYDROXYZINE HCL 50 MG PO TABS: 100.0000 mg | ORAL_TABLET | Freq: Every day | ORAL | 3 refills | Status: DC

## 2021-04-10 NOTE — Telephone Encounter (Signed)
Pt was seen today & referral was placed.

## 2021-04-10 NOTE — Patient Instructions (Addendum)
Please remain on Eliquis 5 mg twice daily, Cardizem 120 mg as discussed.  We will work on moving up cardiology to sooner.    Referral for endocrine has been placed  Let us know if you dont hear back within a week in regards to an appointment being scheduled.

## 2021-04-10 NOTE — Progress Notes (Signed)
Subjective:    Patient ID: Alexis Mcintosh, female    DOB: 1951/02/06, 70 y.o.   MRN: [id number]  CC: Alexis Mcintosh is a 70 y.o. female who presents today for follow up.   HPI: HFU for new onset atrial fibrillation.  Admitted 03/26/21 and discharged 03/28/21  History of hypothyroidism-compliant with Synthroid 125 MCG daily. In the past couple of months she had been on 150 mcg, 137 mcg.  She has no history of thyroid nodules.  Overall feels she feels well today.  No chest pain, dizziness.  Palpitations have improved greatly with the addition of Cardizem.  She denies any sustained episodes of palpitations or recurrent episodes of palpitations.  She is breathing well.  She is concerned that recent episode of atrial fibrillation is related to symptoms of hypothyroidism.  She would like to see endocrinology      Seen by Dr. Birdie Sons, 03/26/2021 for follow-up.  Tachycardic on exam.  She was advised to go to emergency room for atrial fib with RVR. Admission course significant for: Chest x-ray negative for acute cardiopulmonary disease.  EKG showed atrial flutter with heart rate of 147.  She was placed on diltiazem infusion in ED.  She received Eliquis 5 mg in the ED. Synthroid was held for 2 ( 3 days per patient)  days during hospitalization Troponin 8 , 7  Hemoglobin 13.7 TSH 0.232 ( prior 0.4)   discharged on Cardizem 120 mg, Eliquis 5 mg twice daily.   HISTORY:  Past Medical History:  Diagnosis Date   Thyroid disease    Past Surgical History:  Procedure Laterality Date   APPENDECTOMY     Family History  Problem Relation Age of Onset   Cancer Mother        Breast   Hypertension Mother    Diabetes Mother    Breast cancer Mother 17   Stroke Father    Hypertension Father    Breast cancer Paternal Grandmother 65    Allergies: Amoxicillin Current Outpatient Medications on File Prior to Visit  Medication Sig Dispense Refill   Cholecalciferol (VITAMIN D3) 2000 units TABS Take  by mouth.     Coenzyme Q10 200 MG capsule Take 200 mg by mouth daily.     Melatonin 5 MG TABS Take by mouth.     Multiple Vitamin (MULTIVITAMIN) tablet Take 1 tablet by mouth daily.     Omega-3 Fatty Acids (FISH OIL) 1000 MG CAPS Take by mouth.     No current facility-administered medications on file prior to visit.    Social History   Tobacco Use   Smoking status: Never   Smokeless tobacco: Never  Substance Use Topics   Alcohol use: Yes   Drug use: No    Review of Systems  Constitutional:  Negative for chills and fever.  Respiratory:  Negative for cough and shortness of breath.   Cardiovascular:  Positive for palpitations. Negative for chest pain and leg swelling.  Gastrointestinal:  Negative for nausea and vomiting.     Objective:    BP 103/72 (BP Location: Left Arm, Patient Position: Sitting, Cuff Size: Large)   Pulse 77   Temp 97.8 F (36.6 C) (Oral)   Resp 12   Ht 5\' 2"  (1.575 m)   Wt 149 lb 6.4 oz (67.8 kg)   SpO2 99%   BMI 27.33 kg/m  BP Readings from Last 3 Encounters:  04/10/21 103/72  03/28/21 109/81  03/26/21 120/80   Wt Readings from Last 3 Encounters:  04/10/21  149 lb 6.4 oz (67.8 kg)  03/28/21 152 lb 1.9 oz (69 kg)  03/26/21 152 lb 6.4 oz (69.1 kg)    Physical Exam Vitals reviewed.  Constitutional:      Appearance: She is well-developed.  Eyes:     Conjunctiva/sclera: Conjunctivae normal.  Neck:     Thyroid: No thyroid mass, thyromegaly or thyroid tenderness.  Cardiovascular:     Rate and Rhythm: Normal rate and regular rhythm.     Pulses: Normal pulses.     Heart sounds: Normal heart sounds.  Pulmonary:     Effort: Pulmonary effort is normal.     Breath sounds: Normal breath sounds. No wheezing, rhonchi or rales.  Skin:    General: Skin is warm and dry.  Neurological:     Mental Status: She is alert.  Psychiatric:        Speech: Speech normal.        Behavior: Behavior normal.        Thought Content: Thought content normal.        Assessment & Plan:   Problem List Items Addressed This Visit       Cardiovascular and Mediastinum   New onset Atrial flutter with rapid ventricular response [facility])    Reviewed hospitalization course with patient today.  Medications reconciled.  Palpitations improved.  Advised to continue Eliquis 5 mg twice daily, Cardizem 120 mg qd.  She has upcoming appointment with Dr. Okey Dupre 05/16/2021.      Relevant Medications   diltiazem (CARDIZEM CD) 120 MG 24 hr capsule   apixaban (ELIQUIS) 5 MG TABS tablet     Endocrine   Hypothyroidism - Primary    She continues to have low TSH.  Synthroid was held for 3 days per patient during hospitalization.  Advised to have TSH rechecked in 6 weeks time.  Referral placed for follow-up with endocrinology.  She may have TSH checked early November with PCP, unless seen earlier by endocrinology.      Relevant Medications   levothyroxine (SYNTHROID) 125 MCG tablet   Other Relevant Orders   Ambulatory referral to Endocrinology     Musculoskeletal and Integument   Eczema   Relevant Medications   desonide (DESOWEN) 0.05 % lotion   Hives   Relevant Medications   hydrOXYzine (ATARAX/VISTARIL) 50 MG tablet     Other   Seasonal allergies   Relevant Medications   fexofenadine (ALLEGRA) 180 MG tablet   mometasone (NASONEX) 50 MCG/ACT nasal spray   Other Visit Diagnoses     Gastroesophageal reflux disease       Relevant Medications   omeprazole (PRILOSEC) 20 MG capsule        I have discontinued Alexis Mcintosh's levothyroxine. I have also changed her levothyroxine, fexofenadine, omeprazole, and desonide. Additionally, I am having her maintain her Fish Oil, Coenzyme Q10, Vitamin D3, melatonin, multivitamin, diltiazem, apixaban, hydrOXYzine, and mometasone.   Meds ordered this encounter  Medications   diltiazem (CARDIZEM CD) 120 MG 24 hr capsule    Sig: Take 1 capsule (120 mg total) by mouth daily.    Dispense:  90 capsule    Refill:  1     Order Specific Question:   Supervising Provider    Answer:   Sherlene Shams [[id number]]   levothyroxine (SYNTHROID) 125 MCG tablet    Sig: Take 1 tablet (125 mcg total) by mouth daily before breakfast.    Dispense:  90 tablet    Refill:  1    Order Specific  Question:   Supervising Provider    Answer:   Sherlene Shams [[id number]]   apixaban (ELIQUIS) 5 MG TABS tablet    Sig: Take 1 tablet (5 mg total) by mouth 2 (two) times daily.    Dispense:  120 tablet    Refill:  1    Order Specific Question:   Supervising Provider    Answer:   Duncan Dull L [[id number]]   fexofenadine (ALLEGRA) 180 MG tablet    Sig: Take 1 tablet (180 mg total) by mouth daily.    Dispense:  90 tablet    Refill:  3    Order Specific Question:   Supervising Provider    Answer:   Duncan Dull L [[id number]]   omeprazole (PRILOSEC) 20 MG capsule    Sig: Take 1 capsule (20 mg total) by mouth every other day. TAKE 1 CAPSULE BY MOUTH EVERY DAY    Dispense:  90 capsule    Refill:  1    Order Specific Question:   Supervising Provider    Answer:   Duncan Dull L [[id number]]   desonide (DESOWEN) 0.05 % lotion    Sig: Apply topically 2 (two) times daily.    Dispense:  59 mL    Refill:  0    Order Specific Question:   Supervising Provider    Answer:   Duncan Dull L [[id number]]   hydrOXYzine (ATARAX/VISTARIL) 50 MG tablet    Sig: Take 2 tablets (100 mg total) by mouth at bedtime.    Dispense:  260 tablet    Refill:  3    Order Specific Question:   Supervising Provider    Answer:   Duncan Dull L [[id number]]   mometasone (NASONEX) 50 MCG/ACT nasal spray    Sig: Place 2 sprays into the nose daily.    Dispense:  3 each    Refill:  3    Order Specific Question:   Supervising Provider    Answer:   Sherlene Shams [[id number]]    Return precautions given.   Risks, benefits, and alternatives of the medications and treatment plan prescribed today were discussed, and patient expressed understanding.   Education regarding symptom management and  diagnosis given to patient on AVS.  Continue to follow with Flinchum, Eula Fried, FNP for routine health maintenance.   Kasandra Knudsen and I agreed with plan.   Rennie Plowman, FNP

## 2021-04-10 NOTE — Progress Notes (Signed)
I called [facility] & next available was but 2 days sooner. Pt was placed on cancellation list however.

## 2021-04-11 ENCOUNTER — Ambulatory Visit: Payer: Medicare HMO | Admitting: Adult Health

## 2021-04-11 NOTE — Assessment & Plan Note (Addendum)
She continues to have low TSH.  Synthroid was held for 3 days per patient during hospitalization.  Advised to have TSH rechecked in 6 weeks time.  Referral placed for follow-up with endocrinology.  She may have TSH checked early November with PCP, unless seen earlier by endocrinology.

## 2021-04-11 NOTE — Assessment & Plan Note (Signed)
Reviewed hospitalization course with patient today.  Medications reconciled.  Palpitations improved.  Advised to continue Eliquis 5 mg twice daily, Cardizem 120 mg qd.  She has upcoming appointment with Dr. Okey Dupre 05/16/2021.

## 2021-04-20 ENCOUNTER — Encounter: Payer: Self-pay | Admitting: Family

## 2021-04-26 ENCOUNTER — Telehealth: Payer: Self-pay | Admitting: Family

## 2021-04-26 DIAGNOSIS — L309 Dermatitis, unspecified: Secondary | ICD-10-CM

## 2021-04-26 NOTE — Telephone Encounter (Signed)
Patient had all of her medications switched from [facility] to [facility] pharmacy. They are needing an updated script for the desonide (DESOWEN) 0.05 % lotion to say how many days this dose will last. They are needing a day supply added to the script. Sent in by Rennie Plowman.   Please advise

## 2021-04-28 NOTE — Telephone Encounter (Signed)
I am going to defer this to the provider that saw the patient for follow-up recently.

## 2021-04-30 ENCOUNTER — Other Ambulatory Visit: Payer: Self-pay

## 2021-04-30 MED ORDER — DESONIDE 0.05 % EX LOTN: TOPICAL_LOTION | Freq: Two times a day (BID) | CUTANEOUS | 0 refills | Status: DC

## 2021-04-30 NOTE — Addendum Note (Signed)
Addended by: Allegra Grana on: 04/30/2021 05:01 PM   Modules accepted: Orders

## 2021-04-30 NOTE — Telephone Encounter (Signed)
Call [facility] I put on desonide script that it should last approx 60 days

## 2021-05-01 ENCOUNTER — Other Ambulatory Visit: Payer: Self-pay

## 2021-05-01 DIAGNOSIS — L309 Dermatitis, unspecified: Secondary | ICD-10-CM

## 2021-05-01 MED ORDER — DESONIDE 0.05 % EX LOTN
TOPICAL_LOTION | Freq: Two times a day (BID) | CUTANEOUS | 0 refills | Status: DC
Start: 2021-05-01 — End: 2021-05-16

## 2021-05-01 NOTE — Telephone Encounter (Signed)
Just FYI I have resent stated a 60 day supply on it. Hope that works & we also received a fax.

## 2021-05-08 ENCOUNTER — Telehealth: Payer: Self-pay

## 2021-05-08 NOTE — Telephone Encounter (Signed)
Left message requesting for patient to call back to let us know if she has ever seen a cardiologist in the past so we could get records for upcoming appointment if applicable.

## 2021-05-16 ENCOUNTER — Ambulatory Visit: Payer: Medicare HMO | Admitting: Internal Medicine

## 2021-05-16 ENCOUNTER — Encounter: Payer: Self-pay | Admitting: Internal Medicine

## 2021-05-16 ENCOUNTER — Other Ambulatory Visit: Payer: Self-pay

## 2021-05-16 VITALS — BP 134/90 | HR 132 | Ht 62.0 in | Wt 152.0 lb

## 2021-05-16 DIAGNOSIS — I4819 Other persistent atrial fibrillation: Secondary | ICD-10-CM | POA: Diagnosis not present

## 2021-05-16 DIAGNOSIS — Z01812 Encounter for preprocedural laboratory examination: Secondary | ICD-10-CM | POA: Diagnosis not present

## 2021-05-16 DIAGNOSIS — Z01818 Encounter for other preprocedural examination: Secondary | ICD-10-CM | POA: Diagnosis not present

## 2021-05-16 DIAGNOSIS — E039 Hypothyroidism, unspecified: Secondary | ICD-10-CM | POA: Diagnosis not present

## 2021-05-16 DIAGNOSIS — I4891 Unspecified atrial fibrillation: Secondary | ICD-10-CM | POA: Diagnosis not present

## 2021-05-16 DIAGNOSIS — I351 Nonrheumatic aortic (valve) insufficiency: Secondary | ICD-10-CM | POA: Diagnosis not present

## 2021-05-16 MED ORDER — DILTIAZEM HCL ER COATED BEADS 240 MG PO CP24: 240.0000 mg | ORAL_CAPSULE | Freq: Every day | ORAL | 1 refills | Status: DC

## 2021-05-16 NOTE — Progress Notes (Addendum)
New Outpatient Visit Date: 05/16/2021  Referring Provider: Berniece Pap, FNP [address] [place],  [place] [address]  Chief Complaint: Atrial fibrillation  HPI:  Alexis Mcintosh is a 70 y.o. female who is being seen today for the evaluation of recently diagnosed atrial fibrillation at the request of Alexis Mcintosh. She has a history of hypothyroidism and GERD.  She presented to the [facility] emergency department on 03/26/2021 with palpitations after having been found to be in atrial fibrillation with rapid ventricular response at her PCPs office.  She received IV fluids in the ED as well as a diltiazem infusion.  She was seen in consultation by Dr. Juliann Pares, who agreed with continued diltiazem therapy and anticoagulation.  Echocardiogram showed normal LVEF.  Work-up during the hospitalization was notable for mild hyperthyroidism.  Today, Alexis Mcintosh reports that she has been feeling fairly well though she has been experiencing more palpitations the last few days.  She attributes this to stress.  She notes some difficulty with managing her hypothyroidism with several reductions in her levothyroxine over the last few months.  She is now scheduled to follow-up with endocrinology but could not get an appointment until January.  She otherwise feels fairly well, denying chest pain and dyspnea with activities.  She feels a little short of breath when she brushes her teeth but attributes this to being a mouth breather.  Chronic intermittent orthostatic lightheadedness is unchanged.  [name] has been compliant with her medications, not having missed any doses of apixaban or diltiazem since her hospitalization in late August.  She denies bleeding.  She notes that she has skipped her levothyroxine for a few days at times and actually feels better with fewer palpitations when she does  this.  --------------------------------------------------------------------------------------------------  Cardiovascular History & Procedures: Cardiovascular Problems: Atrial fibrillation Mild aortic regurgitation  Risk Factors: Age greater than 65  Cath/PCI: None  CV Surgery: None  EP Procedures and Devices: None  Non-Invasive Evaluation(s): TTE (03/27/2021): Normal LV size and wall thickness.  LVEF 60-65%.  Normal RV size and function.  Normal biatrial size.  Trivial mitral and tricuspid regurgitation.  Mild aortic regurgitation.  Recent CV Pertinent Labs: Lab Results  Component Value Date   CHOL 209 (H) 05/18/2019   HDL 84.50 05/18/2019   LDLCALC 111 (H) 05/18/2019   TRIG 65.0 05/18/2019   CHOLHDL 2 05/18/2019   K 4.6 03/26/2021   MG 2.0 03/26/2021   BUN 18 03/26/2021   CREATININE 0.64 03/26/2021    --------------------------------------------------------------------------------------------------  Past Medical History:  Diagnosis Date   Atrial fibrillation (HCC)    GERD (gastroesophageal reflux disease)    Thyroid disease     Past Surgical History:  Procedure Laterality Date   APPENDECTOMY      Current Meds  Medication Sig   apixaban (ELIQUIS) 5 MG TABS tablet Take 1 tablet (5 mg total) by mouth 2 (two) times daily.   Cholecalciferol (VITAMIN D3) 2000 units TABS Take 1 tablet by mouth daily.   Coenzyme Q10 200 MG capsule Take 200 mg by mouth daily.   desonide (DESOWEN) 0.05 % lotion Apply 1 application topically 2 (two) times daily as needed.   diltiazem (CARDIZEM CD) 120 MG 24 hr capsule Take 1 capsule (120 mg total) by mouth daily.   fexofenadine (ALLEGRA) 180 MG tablet Take 1 tablet (180 mg total) by mouth daily.   hydrOXYzine (ATARAX/VISTARIL) 50 MG tablet Take 2 tablets (100 mg total) by mouth at bedtime.   levothyroxine (SYNTHROID) 125  MCG tablet Take 1 tablet (125 mcg total) by mouth daily before breakfast.   Melatonin 5 MG TABS Take 5 mg by  mouth at bedtime.   mometasone (NASONEX) 50 MCG/ACT nasal spray Place 2 sprays into the nose daily.   Multiple Vitamin (MULTIVITAMIN) tablet Take 1 tablet by mouth daily.   Omega-3 Fatty Acids (FISH OIL) 1000 MG CAPS Take 1 capsule by mouth 2 (two) times daily.   omeprazole (PRILOSEC) 20 MG capsule Take 20 mg by mouth every other day.    Allergies: Amoxicillin  Social History   Tobacco Use   Smoking status: Never   Smokeless tobacco: Never  Vaping Use   Vaping Use: Never used  Substance Use Topics   Alcohol use: Yes    Alcohol/week: 10.0 standard drinks    Types: 10 Glasses of wine per week   Drug use: No    Family History  Problem Relation Age of Onset   Cancer Mother        Breast   Hypertension Mother    Diabetes Mother    Breast cancer Mother 62   Atrial fibrillation Mother    Heart disease Father        pacemaker   Stroke Father    Hypertension Father    HIV/AIDS Brother    Breast cancer Paternal Grandmother 70    Review of Systems: A 12-system review of systems was performed and was negative except as noted in the HPI.  --------------------------------------------------------------------------------------------------  Physical Exam: BP 134/90 (BP Location: Right Arm, Patient Position: Sitting, Cuff Size: Normal)   Pulse (!) 132   Ht 5' 2" (1.575 m)   Wt 152 lb (68.9 kg)   SpO2 96%   BMI 27.80 kg/m   General: NAD. HEENT: No conjunctival pallor or scleral icterus. Facemask in place. Neck: Supple without lymphadenopathy, thyromegaly, JVD, or HJR. No carotid bruit. Lungs: Normal work of breathing. Clear to auscultation bilaterally without wheezes or crackles. Heart: Tachycardic and irregularly irregular without murmurs. Non-displaced PMI. Abd: Bowel sounds present. Soft, NT/ND without hepatosplenomegaly Ext: No lower extremity edema. Radial, PT, and DP pulses are 2+ bilaterally Skin: Warm and dry without rash. Neuro: CNIII-XII intact.  Normal fine touch  sensation bilaterally. Psych: Normal mood and affect.  EKG: Atrial fibrillation with rapid ventricular response and nonspecific ST/T changes.  Lab Results  Component Value Date   WBC 5.8 03/26/2021   HGB 13.7 03/26/2021   HCT 40.6 03/26/2021   MCV 93.3 03/26/2021   PLT 266 03/26/2021    Lab Results  Component Value Date   NA 139 03/26/2021   K 4.6 03/26/2021   CL 102 03/26/2021   CO2 27 03/26/2021   BUN 18 03/26/2021   CREATININE 0.64 03/26/2021   GLUCOSE 103 (H) 03/26/2021   ALT 20 12/07/2020    Lab Results  Component Value Date   CHOL 209 (H) 05/18/2019   HDL 84.50 05/18/2019   LDLCALC 111 (H) 05/18/2019   TRIG 65.0 05/18/2019   CHOLHDL 2 05/18/2019     --------------------------------------------------------------------------------------------------  ASSESSMENT AND PLAN: Persistent atrial fibrillation: Ms. Stepanian remains in atrial fibrillation today with suboptimal rate control.  She has intermittently experienced palpitations but otherwise is minimally symptomatic.  I think she would benefit from a trial of rhythm control.  We will plan for cardioversion with Dr. Gollan later this week, given that Ms. Dittus has been on uninterrupted anticoagulation for more than 4 weeks (since late August).  In the meantime, we will increase diltiazem to   240 mg daily (I asked her to skip her dose on the morning of the cardioversion to minimize post cardioversion bradycardia).  We will readdress the need for long-term anticoagulation at follow-up given a CHA2DS2-VASc score of 2 in a woman.  Need for ischemia evaluation will also be readdressed at follow-up.  We will check a CBC, CMP, magnesium, and TSH today.  Hypothyroidism: TSH suppressed on last check in August with some decreases in levothyroxine dosing over the last few months.  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  We will recheck a TSH today.  If it is still significantly  suppressed, we may need to decrease her levothyroxine further and consider deferring cardioversion until normalization of her TSH.  Aortic regurgitation: Mild by recent echo.  No further intervention recommended at this time.  Shared Decision Making/Informed Consent The risks (stroke, cardiac arrhythmias rarely resulting in the need for a temporary or permanent pacemaker, skin irritation or burns and complications associated with conscious sedation including aspiration, arrhythmia, respiratory failure and death), benefits (restoration of normal sinus rhythm) and alternatives of a direct current cardioversion were explained in detail to Ms. Atwater and she agrees to proceed.   Follow-up: Return to clinic in 1 to 2 weeks.  Akeria Hedstrom, MD 05/16/2021 10:28 AM  

## 2021-05-16 NOTE — H&P (View-Only) (Signed)
New Outpatient Visit Date: 05/16/2021  Referring Provider: Berniece Pap, FNP [address] [place],  [place] [address]  Chief Complaint: Atrial fibrillation  HPI:  Alexis Mcintosh is a 70 y.o. female who is being seen today for the evaluation of recently diagnosed atrial fibrillation at the request of Alexis Mcintosh. She has a history of hypothyroidism and GERD.  She presented to the [facility] emergency department on 03/26/2021 with palpitations after having been found to be in atrial fibrillation with rapid ventricular response at her PCPs office.  She received IV fluids in the ED as well as a diltiazem infusion.  She was seen in consultation by Dr. Juliann Pares, who agreed with continued diltiazem therapy and anticoagulation.  Echocardiogram showed normal LVEF.  Work-up during the hospitalization was notable for mild hyperthyroidism.  Today, Alexis Mcintosh reports that she has been feeling fairly well though she has been experiencing more palpitations the last few days.  She attributes this to stress.  She notes some difficulty with managing her hypothyroidism with several reductions in her levothyroxine over the last few months.  She is now scheduled to follow-up with endocrinology but could not get an appointment until January.  She otherwise feels fairly well, denying chest pain and dyspnea with activities.  She feels a little short of breath when she brushes her teeth but attributes this to being a mouth breather.  Chronic intermittent orthostatic lightheadedness is unchanged.  [name] has been compliant with her medications, not having missed any doses of apixaban or diltiazem since her hospitalization in late August.  She denies bleeding.  She notes that she has skipped her levothyroxine for a few days at times and actually feels better with fewer palpitations when she does  this.  --------------------------------------------------------------------------------------------------  Cardiovascular History & Procedures: Cardiovascular Problems: Atrial fibrillation Mild aortic regurgitation  Risk Factors: Age greater than 65  Cath/PCI: None  CV Surgery: None  EP Procedures and Devices: None  Non-Invasive Evaluation(s): TTE (03/27/2021): Normal LV size and wall thickness.  LVEF 60-65%.  Normal RV size and function.  Normal biatrial size.  Trivial mitral and tricuspid regurgitation.  Mild aortic regurgitation.  Recent CV Pertinent Labs: Lab Results  Component Value Date   CHOL 209 (H) 05/18/2019   HDL 84.50 05/18/2019   LDLCALC 111 (H) 05/18/2019   TRIG 65.0 05/18/2019   CHOLHDL 2 05/18/2019   K 4.6 03/26/2021   MG 2.0 03/26/2021   BUN 18 03/26/2021   CREATININE 0.64 03/26/2021    --------------------------------------------------------------------------------------------------  Past Medical History:  Diagnosis Date   Atrial fibrillation (HCC)    GERD (gastroesophageal reflux disease)    Thyroid disease     Past Surgical History:  Procedure Laterality Date   APPENDECTOMY      Current Meds  Medication Sig   apixaban (ELIQUIS) 5 MG TABS tablet Take 1 tablet (5 mg total) by mouth 2 (two) times daily.   Cholecalciferol (VITAMIN D3) 2000 units TABS Take 1 tablet by mouth daily.   Coenzyme Q10 200 MG capsule Take 200 mg by mouth daily.   desonide (DESOWEN) 0.05 % lotion Apply 1 application topically 2 (two) times daily as needed.   diltiazem (CARDIZEM CD) 120 MG 24 hr capsule Take 1 capsule (120 mg total) by mouth daily.   fexofenadine (ALLEGRA) 180 MG tablet Take 1 tablet (180 mg total) by mouth daily.   hydrOXYzine (ATARAX/VISTARIL) 50 MG tablet Take 2 tablets (100 mg total) by mouth at bedtime.   levothyroxine (SYNTHROID) 125  MCG tablet Take 1 tablet (125 mcg total) by mouth daily before breakfast.   Melatonin 5 MG TABS Take 5 mg by  mouth at bedtime.   mometasone (NASONEX) 50 MCG/ACT nasal spray Place 2 sprays into the nose daily.   Multiple Vitamin (MULTIVITAMIN) tablet Take 1 tablet by mouth daily.   Omega-3 Fatty Acids (FISH OIL) 1000 MG CAPS Take 1 capsule by mouth 2 (two) times daily.   omeprazole (PRILOSEC) 20 MG capsule Take 20 mg by mouth every other day.    Allergies: Amoxicillin  Social History   Tobacco Use   Smoking status: Never   Smokeless tobacco: Never  Vaping Use   Vaping Use: Never used  Substance Use Topics   Alcohol use: Yes    Alcohol/week: 10.0 standard drinks    Types: 10 Glasses of wine per week   Drug use: No    Family History  Problem Relation Age of Onset   Cancer Mother        Breast   Hypertension Mother    Diabetes Mother    Breast cancer Mother 31   Atrial fibrillation Mother    Heart disease Father        pacemaker   Stroke Father    Hypertension Father    HIV/AIDS Brother    Breast cancer Paternal Grandmother 18    Review of Systems: A 12-system review of systems was performed and was negative except as noted in the HPI.  --------------------------------------------------------------------------------------------------  Physical Exam: BP 134/90 (BP Location: Right Arm, Patient Position: Sitting, Cuff Size: Normal)   Pulse (!) 132   Ht 5\' 2"  (1.575 m)   Wt 152 lb (68.9 kg)   SpO2 96%   BMI 27.80 kg/m   General: NAD. HEENT: No conjunctival pallor or scleral icterus. Facemask in place. Neck: Supple without lymphadenopathy, thyromegaly, JVD, or HJR. No carotid bruit. Lungs: Normal work of breathing. Clear to auscultation bilaterally without wheezes or crackles. Heart: Tachycardic and irregularly irregular without murmurs. Non-displaced PMI. Abd: Bowel sounds present. Soft, NT/ND without hepatosplenomegaly Ext: No lower extremity edema. Radial, PT, and DP pulses are 2+ bilaterally Skin: Warm and dry without rash. Neuro: CNIII-XII intact.  Normal fine touch  sensation bilaterally. Psych: Normal mood and affect.  EKG: Atrial fibrillation with rapid ventricular response and nonspecific ST/T changes.  Lab Results  Component Value Date   WBC 5.8 03/26/2021   HGB 13.7 03/26/2021   HCT 40.6 03/26/2021   MCV 93.3 03/26/2021   PLT 266 03/26/2021    Lab Results  Component Value Date   NA 139 03/26/2021   K 4.6 03/26/2021   CL 102 03/26/2021   CO2 27 03/26/2021   BUN 18 03/26/2021   CREATININE 0.64 03/26/2021   GLUCOSE 103 (H) 03/26/2021   ALT 20 12/07/2020    Lab Results  Component Value Date   CHOL 209 (H) 05/18/2019   HDL 84.50 05/18/2019   LDLCALC 111 (H) 05/18/2019   TRIG 65.0 05/18/2019   CHOLHDL 2 05/18/2019     --------------------------------------------------------------------------------------------------  ASSESSMENT AND PLAN: Persistent atrial fibrillation: Ms. Schader remains in atrial fibrillation today with suboptimal rate control.  She has intermittently experienced palpitations but otherwise is minimally symptomatic.  I think she would benefit from a trial of rhythm control.  We will plan for cardioversion with Dr. Yetta Flock later this week, given that Ms. Frangos has been on uninterrupted anticoagulation for more than 4 weeks (since late August).  In the meantime, we will increase diltiazem to  240 mg daily (I asked her to skip her dose on the morning of the cardioversion to minimize post cardioversion bradycardia).  We will readdress the need for long-term anticoagulation at follow-up given a CHA2DS2-VASc score of 2 in a woman.  Need for ischemia evaluation will also be readdressed at follow-up.  We will check a CBC, CMP, magnesium, and TSH today.  Hypothyroidism: TSH suppressed on last check in August with some decreases in levothyroxine dosing over the last few months.  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  We will recheck a TSH today.  If it is still significantly  suppressed, we may need to decrease her levothyroxine further and consider deferring cardioversion until normalization of her TSH.  Aortic regurgitation: Mild by recent echo.  No further intervention recommended at this time.  Shared Decision Making/Informed Consent The risks (stroke, cardiac arrhythmias rarely resulting in the need for a temporary or permanent pacemaker, skin irritation or burns and complications associated with conscious sedation including aspiration, arrhythmia, respiratory failure and death), benefits (restoration of normal sinus rhythm) and alternatives of a direct current cardioversion were explained in detail to Ms. Wragg and she agrees to proceed.   Follow-up: Return to clinic in 1 to 2 weeks.  Yvonne Kendall, MD 05/16/2021 10:28 AM

## 2021-05-16 NOTE — Patient Instructions (Signed)
Medication Instructions:   Your physician has recommended you make the following change in your medication:   STOP taking Fish Oil  INCREASE Diltiazem to 240 mg daily     -- You may take TWO tablets of current dose 120 mg TODAY for total of 240 mg    -- A new Rx has been sent to your pharmacy with new dosage  *If you need a refill on your cardiac medications before your next appointment, please call your pharmacy*   Lab Work:  Today: CMET, CBC, Magnesium, TSH  If you have labs (blood work) drawn today and your tests are completely normal, you will receive your results only by: MyChart Message (if you have MyChart) OR A paper copy in the mail If you have any lab test that is abnormal or we need to change your treatment, we will call you to review the results.   Testing/Procedures:  You are scheduled for a Cardioversion this Friday 05/18/21 with Dr.Gollan  Please arrive at [facility] at 6:30 AM on the day of your procedure.  DIET INSTRUCTIONS:   -- DO NOT take your Diltiazem morning of procedure  -- Nothing to eat or drink after midnight except your REMAINING medications with a sip of water.  -- Make sure that you DO take Eliquis with morning medications.         Labs: Pre procedure labs completed today in office.   Medications:  YOU MAY TAKE ALL of your remaining medications with a small amount of water.  Must have a responsible person to drive you home.  Bring a current list of your medications and current insurance cards.    If you have any questions after you get home, please call the office at [phone]    Follow-Up: At [facility], you and your health needs are our priority.  As part of our continuing mission to provide you with exceptional heart care, we have created designated Provider Care Teams.  These Care Teams include your primary Cardiologist (physician) and Advanced Practice Providers (APPs -  Physician Assistants and Nurse  Practitioners) who all work together to provide you with the care you need, when you need it.  We recommend signing up for the patient portal called "MyChart".  Sign up information is provided on this After Visit Summary.  MyChart is used to connect with patients for Virtual Visits (Telemedicine).  Patients are able to view lab/test results, encounter notes, upcoming appointments, etc.  Non-urgent messages can be sent to your provider as well.   To learn more about what you can do with MyChart, go to [email or web address].    Your next appointment:    1 - 2  week(s)  The format for your next appointment:   In Person  Provider:   You may see Dr. Cristal Deer End or one of the following Advanced Practice Providers on your designated Care Team:   Nicolasa Ducking, NP Eula Listen, PA-C Marisue Ivan, PA-C [place], [place]

## 2021-05-17 ENCOUNTER — Telehealth: Payer: Self-pay

## 2021-05-17 DIAGNOSIS — E039 Hypothyroidism, unspecified: Secondary | ICD-10-CM

## 2021-05-17 LAB — COMPREHENSIVE METABOLIC PANEL
ALT: 27 IU/L (ref 0–32)
AST: 23 IU/L (ref 0–40)
Albumin/Globulin Ratio: 2.1 (ref 1.2–2.2)
Albumin: 4.6 g/dL (ref 3.8–4.8)
Alkaline Phosphatase: 97 IU/L (ref 44–121)
BUN/Creatinine Ratio: 16 (ref 12–28)
BUN: 14 mg/dL (ref 8–27)
Bilirubin Total: 0.5 mg/dL (ref 0.0–1.2)
CO2: 24 mmol/L (ref 20–29)
Calcium: 10 mg/dL (ref 8.7–10.3)
Chloride: 103 mmol/L (ref 96–106)
Creatinine, Ser: 0.86 mg/dL (ref 0.57–1.00)
Globulin, Total: 2.2 g/dL (ref 1.5–4.5)
Glucose: 106 mg/dL — ABNORMAL HIGH (ref 70–99)
Potassium: 5.1 mmol/L (ref 3.5–5.2)
Sodium: 142 mmol/L (ref 134–144)
Total Protein: 6.8 g/dL (ref 6.0–8.5)
eGFR: 74 mL/min/{1.73_m2} (ref >59)

## 2021-05-17 LAB — CBC
Hematocrit: 41.5 % (ref 34.0–46.6)
Hemoglobin: 14.4 g/dL (ref 11.1–15.9)
MCH: 31.6 pg (ref 26.6–33.0)
MCHC: 34.7 g/dL (ref 31.5–35.7)
MCV: 91 fL (ref 79–97)
Platelets: 308 10*3/uL (ref 150–450)
RBC: 4.55 x10E6/uL (ref 3.77–5.28)
RDW: 12.3 % (ref 11.7–15.4)
WBC: 4.9 10*3/uL (ref 3.4–10.8)

## 2021-05-17 LAB — MAGNESIUM: Magnesium: 1.9 mg/dL (ref 1.6–2.3)

## 2021-05-17 LAB — TSH: TSH: 0.283 u[IU]/mL — ABNORMAL LOW (ref 0.450–4.500)

## 2021-05-17 NOTE — Telephone Encounter (Signed)
Called patient and left a VM requesting a call back. 

## 2021-05-17 NOTE — Telephone Encounter (Addendum)
-----   Message from Yvonne Kendall, MD sent at 05/17/2021  6:54 AM EDT ----- Please let Ms. Gaeta know that her kidney function, liver function, electrolytes, and blood counts are normal.  Her TSH is slightly higher than in August but is still lower than normal, suggesting that her levothyroxine dose is too high.  I recommend that she hold levothyroxine today and tomorrow and then begin taking levothyroxine 100 mcg daily.  I will forward the results to her PCP as well for her thought regarding management of iatrogenic hyperthyroidism.  I think it is okay to proceed with cardioversion as planned tomorrow.

## 2021-05-18 ENCOUNTER — Ambulatory Visit
Admission: RE | Admit: 2021-05-18 | Discharge: 2021-05-18 | Disposition: A | Discharge status: Home / Self Care | Payer: Medicare HMO | Attending: Cardiovascular Disease | Admitting: Cardiovascular Disease

## 2021-05-18 ENCOUNTER — Encounter
Admission: RE | Disposition: A | Discharge status: Home / Self Care | Payer: Self-pay | Source: Home / Self Care | Attending: Cardiovascular Disease

## 2021-05-18 ENCOUNTER — Ambulatory Visit: Payer: Medicare HMO | Admitting: Certified Registered Nurse Anesthetist

## 2021-05-18 ENCOUNTER — Encounter: Payer: Self-pay | Admitting: Cardiovascular Disease

## 2021-05-18 DIAGNOSIS — E039 Hypothyroidism, unspecified: Secondary | ICD-10-CM | POA: Insufficient documentation

## 2021-05-18 DIAGNOSIS — Z79890 Hormone replacement therapy (postmenopausal): Secondary | ICD-10-CM | POA: Diagnosis not present

## 2021-05-18 DIAGNOSIS — Z7901 Long term (current) use of anticoagulants: Secondary | ICD-10-CM | POA: Diagnosis not present

## 2021-05-18 DIAGNOSIS — I4819 Other persistent atrial fibrillation: Secondary | ICD-10-CM | POA: Insufficient documentation

## 2021-05-18 DIAGNOSIS — Z880 Allergy status to penicillin: Secondary | ICD-10-CM | POA: Insufficient documentation

## 2021-05-18 DIAGNOSIS — Z7951 Long term (current) use of inhaled steroids: Secondary | ICD-10-CM | POA: Diagnosis not present

## 2021-05-18 DIAGNOSIS — I4891 Unspecified atrial fibrillation: Secondary | ICD-10-CM | POA: Diagnosis not present

## 2021-05-18 DIAGNOSIS — I351 Nonrheumatic aortic (valve) insufficiency: Secondary | ICD-10-CM | POA: Diagnosis not present

## 2021-05-18 HISTORY — PX: CARDIOVERSION: SHX1299

## 2021-05-18 SURGERY — CARDIOVERSION
Anesthesia: General

## 2021-05-18 MED ORDER — PHENYLEPHRINE HCL (PRESSORS) 10 MG/ML IV SOLN
INTRAVENOUS | Status: AC
  Filled 2021-05-18: qty 1

## 2021-05-18 MED ORDER — ONDANSETRON HCL 4 MG/2ML IJ SOLN: 4.0000 mg | Freq: Once | INTRAMUSCULAR | Status: DC | PRN

## 2021-05-18 MED ORDER — FENTANYL CITRATE (PF) 100 MCG/2ML IJ SOLN: 25.0000 ug | INTRAMUSCULAR | Status: DC | PRN

## 2021-05-18 MED ORDER — PROPOFOL 10 MG/ML IV BOLUS
INTRAVENOUS | Status: DC | PRN
  Administered 2021-05-18 (×2): 20 mg via INTRAVENOUS
  Administered 2021-05-18: 40 mg via INTRAVENOUS

## 2021-05-18 MED ORDER — PROPOFOL 10 MG/ML IV BOLUS: INTRAVENOUS | Status: DC | PRN

## 2021-05-18 MED ORDER — PROPOFOL 500 MG/50ML IV EMUL
INTRAVENOUS | Status: AC
  Filled 2021-05-18: qty 50

## 2021-05-18 MED ORDER — SODIUM CHLORIDE 0.9 % IV SOLN: INTRAVENOUS | Status: DC | PRN

## 2021-05-18 NOTE — Transfer of Care (Signed)
Immediate Anesthesia Transfer of Care Note  Patient: Alexis Mcintosh  Procedure(s) Performed: CARDIOVERSION  Patient Location: PACU  Anesthesia Type:General  Level of Consciousness: awake and alert   Airway & Oxygen Therapy: Patient Spontanous Breathing and Patient connected to nasal cannula oxygen  Post-op Assessment: Report given to RN and Post -op Vital signs reviewed and stable  Post vital signs: Reviewed and stable  Last Vitals:  Vitals Value Taken Time  BP 108/95 05/18/21 0757  Temp    Pulse 72 05/18/21 0758  Resp 18 05/18/21 0758  SpO2 96 % 05/18/21 0758  Vitals shown include unvalidated device data.  Last Pain:  Vitals:   05/18/21 0650  TempSrc: Oral  PainSc: 0-No pain         Complications: No notable events documented.

## 2021-05-18 NOTE — CV Procedure (Signed)
Cardioversion procedure note For atrial fibrillation, persistent  Procedure Details:  Consent: Risks of procedure as well as the alternatives and risks of each were explained to the (patient/caregiver).  Consent for procedure obtained.  Time Out: Verified patient identification, verified procedure, site/side was marked, verified correct patient position, special equipment/implants available, medications/allergies/relevent history reviewed, required imaging and test results available.  Performed  Patient placed on cardiac monitor, pulse oximetry, supplemental oxygen as necessary.   Sedation given: propofol IV, Dr. Pernell Dupre Pacer pads placed anterior and posterior chest.   Cardioverted 3 time(s).   Cardioverted at  150J, 200J, 200J with chest compression. Synchronized biphasic Converted to NSR   Evaluation: Findings: Post procedure EKG shows: NSR Complications: None Patient did tolerate procedure well.  Time Spent Directly with the Patient:  45 minutes   Dossie Arbour, M.D., Ph.D.

## 2021-05-18 NOTE — Telephone Encounter (Signed)
Attempted to call the patient. No answer- I left a message to please call back.  

## 2021-05-18 NOTE — Anesthesia Preprocedure Evaluation (Signed)
Anesthesia Evaluation  Patient identified by MRN, date of birth, ID band Patient awake    Reviewed: Allergy & Precautions, H&P , NPO status , Patient's Chart, lab work & pertinent test results, reviewed documented beta blocker date and time   Airway Mallampati: II   Neck ROM: full    Dental  (+) Poor Dentition   Pulmonary neg pulmonary ROS,    Pulmonary exam normal        Cardiovascular Exercise Tolerance: Poor negative cardio ROS  Atrial Fibrillation  Rhythm:regular Rate:Normal     Neuro/Psych negative neurological ROS  negative psych ROS   GI/Hepatic Neg liver ROS, GERD  Medicated,  Endo/Other  Hypothyroidism   Renal/GU negative Renal ROS  negative genitourinary   Musculoskeletal   Abdominal   Peds  Hematology negative hematology ROS (+)   Anesthesia Other Findings Past Medical History: No date: Atrial fibrillation (HCC) No date: GERD (gastroesophageal reflux disease) No date: Thyroid disease Past Surgical History: No date: APPENDECTOMY BMI    Body Mass Index: 27.44 kg/m     Reproductive/Obstetrics negative OB ROS                             Anesthesia Physical Anesthesia Plan  ASA: 4  Anesthesia Plan: General   Post-op Pain Management:    Induction:   PONV Risk Score and Plan:   Airway Management Planned:   Additional Equipment:   Intra-op Plan:   Post-operative Plan:   Informed Consent: I have reviewed the patients History and Physical, chart, labs and discussed the procedure including the risks, benefits and alternatives for the proposed anesthesia with the patient or authorized representative who has indicated his/her understanding and acceptance.     Dental Advisory Given  Plan Discussed with: CRNA  Anesthesia Plan Comments:         Anesthesia Quick Evaluation

## 2021-05-20 NOTE — H&P (Signed)
H&P Addendum, pre-cardioversion ° °Patient was seen and evaluated prior to -cardioversion procedure °Symptoms, prior testing details again confirmed with the patient °Patient examined, no significant change from prior exam °Lab work reviewed in detail personally by myself °Patient understands risk and benefit of the procedure,  °The risks (stroke, cardiac arrhythmias rarely resulting in the need for a temporary or permanent pacemaker, skin irritation or burns and complications associated with conscious sedation including aspiration, arrhythmia, respiratory failure and death), benefits (restoration of normal sinus rhythm) and alternatives of a direct current cardioversion were explained in detail °Patient willing to proceed. ° °Signed, °Tim Mistee Soliman, MD, Ph.D °[facility]  °

## 2021-05-20 NOTE — Interval H&P Note (Signed)
History and Physical Interval Note:  05/20/2021 10:42 AM  Alexis Mcintosh  has presented today for surgery, with the diagnosis of Cardioversion   AFib.  The various methods of treatment have been discussed with the patient and family. After consideration of risks, benefits and other options for treatment, the patient has consented to  Procedure(s): CARDIOVERSION (N/A) as a surgical intervention.  The patient's history has been reviewed, patient examined, no change in status, stable for surgery.  I have reviewed the patient's chart and labs.  Questions were answered to the patient's satisfaction.     [name]

## 2021-05-21 DIAGNOSIS — I4819 Other persistent atrial fibrillation: Secondary | ICD-10-CM | POA: Diagnosis not present

## 2021-05-21 DIAGNOSIS — Z0000 Encounter for general adult medical examination without abnormal findings: Secondary | ICD-10-CM | POA: Diagnosis not present

## 2021-05-21 DIAGNOSIS — E039 Hypothyroidism, unspecified: Secondary | ICD-10-CM | POA: Diagnosis not present

## 2021-05-21 DIAGNOSIS — Z23 Encounter for immunization: Secondary | ICD-10-CM | POA: Diagnosis not present

## 2021-05-21 NOTE — Telephone Encounter (Signed)
Attempted to call pt. No answer on third attempt. Lmtcb.  MyChart message sent to pt asking to call office to discuss results.

## 2021-05-22 MED ORDER — LEVOTHYROXINE SODIUM 100 MCG PO TABS
100.0000 ug | ORAL_TABLET | Freq: Every day | ORAL | 1 refills | Status: DC
Start: 2021-05-22 — End: 2021-06-15

## 2021-05-22 NOTE — Anesthesia Postprocedure Evaluation (Signed)
Anesthesia Post Note  Patient: Khalessi Blough  Procedure(s) Performed: CARDIOVERSION  Patient location during evaluation: PACU Anesthesia Type: General Level of consciousness: awake and alert Pain management: pain level controlled Vital Signs Assessment: post-procedure vital signs reviewed and stable Respiratory status: spontaneous breathing, nonlabored ventilation, respiratory function stable and patient connected to nasal cannula oxygen Cardiovascular status: blood pressure returned to baseline and stable Postop Assessment: no apparent nausea or vomiting Anesthetic complications: no   No notable events documented.   Last Vitals:  Vitals:   05/18/21 0830 05/18/21 0845  BP: 118/77 128/76  Pulse: 67 78  Resp: 12 (!) 22  Temp:    SpO2: 100% 98%    Last Pain:  Vitals:   05/18/21 0845  TempSrc:   PainSc: 0-No pain                 Yevette Edwards

## 2021-05-22 NOTE — Telephone Encounter (Signed)
Spoke with pt, notified of lab results and Dr. Serita Kyle recc.  Pt voiced understanding.  Pt states that she has actually held Levothyroxine for 3 days now, so she will start Levothyroxine 100 mcg daily today.  Rx has been sent to pharmacy.  Pt reports that her PCP has changed to Dr. Enid Baas.  I have updated in her chart and faxed results to her office @ [phone] for her thought regarding management of iatrogenic hyperthyroidism.   Pt also has a question for Dr. Okey Dupre. Pt asks if she is to continue incr dose of Diltiazem 240 mg daily after her cardioversion.  Pt does have follow up scheduled with Marisue Ivan, PA 05/30/21.  Notified pt I will forward to Dr. Okey Dupre to clarify if she is to continue this dose until follow up.

## 2021-05-22 NOTE — Telephone Encounter (Signed)
Patient returning call.

## 2021-05-22 NOTE — Telephone Encounter (Signed)
Attempted to call pt back. No answer. LDM stating to continue dose until I have further recc from Dr. Okey Dupre.

## 2021-05-23 NOTE — Telephone Encounter (Signed)
Discussed with Dr. Okey Dupre. Attempted to call pt to discuss further, no answer. LDM on pt's vm, DPR approved.  Notified pt on vm that she may continue current dose of Diltiazem 240 mg daily at this time.  Her HR was doing well after cardioversion in the hospital. Advised that she continue to monitor HR at home using BP cuff or pulse ox if available and to let us know if her HR <60. Advised pt call back to discuss further if she has any questions.

## 2021-05-29 DIAGNOSIS — H2513 Age-related nuclear cataract, bilateral: Secondary | ICD-10-CM | POA: Diagnosis not present

## 2021-05-30 ENCOUNTER — Ambulatory Visit: Payer: Medicare HMO | Admitting: Physician Assistant

## 2021-06-01 ENCOUNTER — Other Ambulatory Visit: Payer: Self-pay

## 2021-06-01 ENCOUNTER — Encounter: Payer: Self-pay | Admitting: Physician Assistant

## 2021-06-01 ENCOUNTER — Ambulatory Visit: Payer: Medicare HMO | Admitting: Physician Assistant

## 2021-06-01 VITALS — BP 102/72 | HR 119 | Ht 62.0 in | Wt 151.0 lb

## 2021-06-01 DIAGNOSIS — E039 Hypothyroidism, unspecified: Secondary | ICD-10-CM

## 2021-06-01 DIAGNOSIS — I351 Nonrheumatic aortic (valve) insufficiency: Secondary | ICD-10-CM | POA: Diagnosis not present

## 2021-06-01 DIAGNOSIS — I4819 Other persistent atrial fibrillation: Secondary | ICD-10-CM

## 2021-06-01 DIAGNOSIS — R0600 Dyspnea, unspecified: Secondary | ICD-10-CM | POA: Diagnosis not present

## 2021-06-01 NOTE — Patient Instructions (Signed)
Medication Instructions:  - Your physician recommends that you continue on your current medications as directed. Please refer to the Current Medication list given to you today.  *If you need a refill on your cardiac medications before your next appointment, please call your pharmacy*   Lab Work: - Your physician recommends that you have lab work today: TSH/ Free T4/ BMP  If you have labs (blood work) drawn today and your tests are completely normal, you will receive your results only by: MyChart Message (if you have MyChart) OR A paper copy in the mail If you have any lab test that is abnormal or we need to change your treatment, we will call you to review the results.   Testing/Procedures: 1) Lexiscan Myoview (Chemical Stress test)  - Your physician has requested that you have a lexiscan myoview.   [facility] MYOVIEW  Your caregiver has ordered a Stress Test with nuclear imaging. The purpose of this test is to evaluate the blood supply to your heart muscle. This procedure is referred to as a "Non-Invasive Stress Test." This is because other than having an IV started in your vein, nothing is inserted or "invades" your body. Cardiac stress tests are done to find areas of poor blood flow to the heart by determining the extent of coronary artery disease (CAD). Some patients exercise on a treadmill, which naturally increases the blood flow to your heart, while others who are  unable to walk on a treadmill due to physical limitations have a pharmacologic/chemical stress agent called Lexiscan . This medicine will mimic walking on a treadmill by temporarily increasing your coronary blood flow.   Please note: these test may take anywhere between 2-4 hours to complete  PLEASE REPORT TO [facility] MEDICAL MALL ENTRANCE  THE VOLUNTEERS AT THE FIRST DESK WILL DIRECT YOU WHERE TO GO  Date of Procedure:_____________________________________  Arrival Time for  Procedure:______________________________  Instructions regarding medication:   __x__ : You may take all of your regular morning medications the day of your test with enough water to get them down safely  PLEASE NOTIFY THE OFFICE AT LEAST 24 HOURS IN ADVANCE IF YOU ARE UNABLE TO KEEP YOUR APPOINTMENT.  [phone] AND  PLEASE NOTIFY NUCLEAR MEDICINE AT [facility] AT LEAST 24 HOURS IN ADVANCE IF YOU ARE UNABLE TO KEEP YOUR APPOINTMENT. [phone]  How to prepare for your Myoview test:  Do not eat or drink after midnight No caffeine for 24 hours prior to test No smoking 24 hours prior to test. Your medication may be taken with water.  If your doctor stopped a medication because of this test, do not take that medication. Ladies, please do not wear dresses.  Skirts or pants are appropriate. Please wear a short sleeve shirt. No perfume, cologne or lotion. Wear comfortable walking shoes. No heels!    2) You have been referred to : Dr. Steffanie Dunn (Electrophysiology)- end of January- for atrial fibrillation     Follow-Up: At [facility], you and your health needs are our priority.  As part of our continuing mission to provide you with exceptional heart care, we have created designated Provider Care Teams.  These Care Teams include your primary Cardiologist (physician) and Advanced Practice Providers (APPs -  Physician Assistants and Nurse Practitioners) who all work together to provide you with the care you need, when you need it.  We recommend signing up for the patient portal called "MyChart".  Sign up information is provided on this After Visit Summary.  MyChart is  used to connect with patients for Virtual Visits (Telemedicine).  Patients are able to view lab/test results, encounter notes, upcoming appointments, etc.  Non-urgent messages can be sent to your provider as well.   To learn more about what you can do with MyChart, go to [email or web address].    Your next appointment:    After stress testing is completed   The format for your next appointment:   In Person  Provider:   You may see Yvonne Kendall, MD or one of the following Advanced Practice Providers on your designated Care Team:   Nicolasa Ducking, NP Eula Listen, PA-C Marisue Ivan, PA-C Cadence Fransico Michael, [place]   Other Instructions  Cardiac Nuclear Scan A cardiac nuclear scan is a test that is done to check the flow of blood to your heart. It is done when you are resting and when you are exercising. The test looks for problems such as: Not enough blood reaching a portion of the heart. The heart muscle not working as it should. You may need this test if: You have heart disease. You have had lab results that are not normal. You have had heart surgery or a balloon procedure to open up blocked arteries (angioplasty). You have chest pain. You have shortness of breath. In this test, a special dye (tracer) is put into your bloodstream. The tracer will travel to your heart. A camera will then take pictures of your heart to see how the tracer moves through your heart. This test is usually done at a hospital and takes 2-4 hours. Tell a doctor about: Any allergies you have. All medicines you are taking, including vitamins, herbs, eye drops, creams, and over-the-counter medicines. Any problems you or family members have had with anesthetic medicines. Any blood disorders you have. Any surgeries you have had. Any medical conditions you have. Whether you are pregnant or may be pregnant. What are the risks? Generally, this is a safe test. However, problems may occur, such as: Serious chest pain and heart attack. This is only a risk if the stress portion of the test is done. Rapid heartbeat. A feeling of warmth in your chest. This feeling usually does not last long. Allergic reaction to the tracer. What happens before the test? Ask your doctor about changing or stopping your normal medicines. This is  important. Follow instructions from your doctor about what you cannot eat or drink. Remove your jewelry on the day of the test. What happens during the test? An IV tube will be inserted into one of your veins. Your doctor will give you a small amount of tracer through the IV tube. You will wait for 20-40 minutes while the tracer moves through your bloodstream. Your heart will be monitored with an electrocardiogram (ECG). You will lie down on an exam table. Pictures of your heart will be taken for about 15-20 minutes. You may also have a stress test. For this test, one of these things may be done: You will be asked to exercise on a treadmill or a stationary bike. You will be given medicines that will make your heart work harder. This is done if you are unable to exercise. When blood flow to your heart has peaked, a tracer will again be given through the IV tube. After 20-40 minutes, you will get back on the exam table. More pictures will be taken of your heart. Depending on the tracer that is used, more pictures may need to be taken 3-4 hours later. Your IV tube will be  removed when the test is over. The test may vary among doctors and hospitals. What happens after the test? Ask your doctor: Whether you can return to your normal schedule, including diet, activities, and medicines. Whether you should drink more fluids. This will help to remove the tracer from your body. Drink enough fluid to keep your pee (urine) pale yellow. Ask your doctor, or the department that is doing the test: When will my results be ready? How will I get my results? Summary A cardiac nuclear scan is a test that is done to check the flow of blood to your heart. Tell your doctor whether you are pregnant or may be pregnant. Before the test, ask your doctor about changing or stopping your normal medicines. This is important. Ask your doctor whether you can return to your normal activities. You may be asked to drink  more fluids. This information is not intended to replace advice given to you by your health care provider. Make sure you discuss any questions you have with your health care provider. Document Revised: 11/11/2018 Document Reviewed: 01/05/2018 Elsevier Patient Education  2022 Elsevier Inc.   Cardiac Ablation Cardiac ablation is a procedure to destroy, or ablate, a small amount of heart tissue in very specific places. The heart has many electrical connections. Sometimes these connections are abnormal and can cause the heart to beat very fast or irregularly. Ablating some of the areas that cause problems can improve the heart's rhythm or return it to normal. Ablation may be done for people who: Have Wolff-Parkinson-White syndrome. Have fast heart rhythms (tachycardia). Have taken medicines for an abnormal heart rhythm (arrhythmia) that were not effective or caused side effects. Have a high-risk heartbeat that may be life-threatening. During the procedure, a small incision is made in the neck or the groin, and a long, thin tube (catheter) is inserted into the incision and moved to the heart. Small devices (electrodes) on the tip of the catheter will send out electrical currents. A type of X-ray (fluoroscopy) will be used to help guide the catheter and to provide images of the heart. Tell a health care provider about: Any allergies you have. All medicines you are taking, including vitamins, herbs, eye drops, creams, and over-the-counter medicines. Any problems you or family members have had with anesthetic medicines. Any blood disorders you have. Any surgeries you have had. Any medical conditions you have, such as kidney failure. Whether you are pregnant or may be pregnant. What are the risks? Generally, this is a safe procedure. However, problems may occur, including: Infection. Bruising and bleeding at the catheter insertion site. Bleeding into the chest, especially into the sac that surrounds  the heart. This is a serious complication. Stroke or blood clots. Damage to nearby structures or organs. Allergic reaction to medicines or dyes. Need for a permanent pacemaker if the normal electrical system is damaged. A pacemaker is a small computer that sends electrical signals to the heart and helps your heart beat normally. The procedure not being fully effective. This may not be recognized until months later. Repeat ablation procedures are sometimes done. What happens before the procedure? Medicines Ask your health care provider about: Changing or stopping your regular medicines. This is especially important if you are taking diabetes medicines or blood thinners. Taking medicines such as aspirin and ibuprofen. These medicines can thin your blood. Do not take these medicines unless your health care provider tells you to take them. Taking over-the-counter medicines, vitamins, herbs, and supplements. General instructions Follow  instructions from your health care provider about eating or drinking restrictions. Plan to have someone take you home from the hospital or clinic. If you will be going home right after the procedure, plan to have someone with you for 24 hours. Ask your health care provider what steps will be taken to prevent infection. What happens during the procedure?  An IV will be inserted into one of your veins. You will be given a medicine to help you relax (sedative). The skin on your neck or groin will be numbed. An incision will be made in your neck or your groin. A needle will be inserted through the incision and into a large vein in your neck or groin. A catheter will be inserted into the needle and moved to your heart. Dye may be injected through the catheter to help your surgeon see the area of the heart that needs treatment. Electrical currents will be sent from the catheter to ablate heart tissue in desired areas. There are three types of energy that may be used to  do this: Heat (radiofrequency energy). Laser energy. Extreme cold (cryoablation). When the tissue has been ablated, the catheter will be removed. Pressure will be held on the insertion area to prevent a lot of bleeding. A bandage (dressing) will be placed over the insertion area. The exact procedure may vary among health care providers and hospitals. What happens after the procedure? Your blood pressure, heart rate, breathing rate, and blood oxygen level will be monitored until you leave the hospital or clinic. Your insertion area will be monitored for bleeding. You will need to lie still for a few hours to ensure that you do not bleed from the insertion area. Do not drive for 24 hours or as long as told by your health care provider. Summary Cardiac ablation is a procedure to destroy, or ablate, a small amount of heart tissue using an electrical current. This procedure can improve the heart rhythm or return it to normal. Tell your health care provider about any medical conditions you may have and all medicines you are taking to treat them. This is a safe procedure, but problems may occur. Problems may include infection, bruising, damage to nearby organs or structures, or allergic reactions to medicines. Follow your health care provider's instructions about eating and drinking before the procedure. You may also be told to change or stop some of your medicines. After the procedure, do not drive for 24 hours or as long as told by your health care provider. This information is not intended to replace advice given to you by your health care provider. Make sure you discuss any questions you have with your health care provider. Document Revised: 05/31/2019 Document Reviewed: 05/31/2019 Elsevier Patient Education  2022 [employer].

## 2021-06-01 NOTE — Progress Notes (Signed)
Office Visit    Patient Name: Alexis Mcintosh Date of Encounter: 06/01/2021  PCP:  Gladstone Lighter, MD   [place]  Cardiologist: Dr. Saunders Revel Advanced Practice Provider:  No care team member to display Electrophysiologist:  None :[phone]}   Chief Complaint    Chief Complaint  Patient presents with   Follow-up    2 Week follow up and medications verbally reviewed with patient.     70 y.o. female with history of recently diagnosed atrial fibrillation, hypothyroidism, GERD, and here today for 2-week follow-up.  Past Medical History    Past Medical History:  Diagnosis Date   Atrial fibrillation ([place])    GERD (gastroesophageal reflux disease)    Thyroid disease    Past Surgical History:  Procedure Laterality Date   APPENDECTOMY     CARDIOVERSION N/A 05/18/2021   Procedure: CARDIOVERSION;  Surgeon: Minna Merritts, MD;  Location: [facility] ORS;  Service: Cardiovascular;  Laterality: N/A;    Allergies  Allergies  Allergen Reactions   Amoxicillin Hives    History of Present Illness    Alexis Mcintosh is a 70 y.o. female with PMH as above.  She has history of hypothyroidism, GERD, and atrial fibrillation.  She presented to [facility] emergency department 03/26/2021 with palpitations and after having been found to be in atrial fibrillation with rapid ventricular response at her PCPs office.  She received IV fluid in the ED and IV diltiazem.  She was continued on diltiazem and anticoagulation by Dr. Georgia Dom.  Echo showed normal LVEF.  Work-up was notable for mild hypothyroidism.  She was seen at follow-up 05/26/2021 and doing well, though experiencing more palpitations.  She attributed this to stress.  She reported some difficulty managing her thyroid issues with several changes to her medication.  She was scheduled for follow-up with endocrinology.  She felt a little short of breath when brushing her teeth, attributing this to being a mouth breather.  Her  intermittent orthostatic lightheadedness was unchanged.  She was compliant with anticoagulation and scheduled for cardioversion 05/18/2021.  Diltiazem was increased to 240 mg daily.  It was noted that we would readdress the need for long-term anticoagulation at follow-up given CHA2DS2-VASc score of 2.  Need for ischemic evaluation would also be readdressed at follow-up.  She underwent her cardioversion 05/18/2021 with diltiazem decreased to 120 mg daily.  She is kept a BP/HR log since that time as transcribed below.  Today, 06/01/2021, she is unfortunately back in atrial fibrillation with RVR.  No chest pain or shortness of breath at rest.  No presyncope or syncope.  No signs or symptoms of volume overload.  She reports minimal symptoms in atrial fibrillation with only occasional tachypalpitations.  She still notes some shortness of breath with brushing her teeth, though significantly improved.  She reports overall feeling better after her cardioversion and is surprised to learn she is back in atrial fibrillation.  She is remained compliant with anticoagulation.  No signs or symptoms of bleeding.  Home Medications   Current Outpatient Medications  Medication Instructions   apixaban (ELIQUIS) 5 mg, Oral, 2 times daily   Calcium Citrate-Vitamin D (CALCIUM CITRATE + D3) 200-250 MG-UNIT TABS 1 tablet, Oral, Daily   Coenzyme Q10 200 mg, Oral, Daily   cycloSPORINE (RESTASIS) 0.05 % ophthalmic emulsion 1 drop, Both Eyes, 2 times daily   desonide (DESOWEN) 2.03 % lotion 1 application, Topical, 2 times daily PRN   diltiazem (CARDIZEM CD) 120 mg, Oral, Daily  fexofenadine (ALLEGRA) 180 mg, Oral, Daily   hydrOXYzine (ATARAX/VISTARIL) 100 mg, Oral, Daily at bedtime   levothyroxine (SYNTHROID) 100 mcg, Oral, Daily before breakfast   melatonin 5 mg, Oral, Daily at bedtime   mometasone (NASONEX) 50 MCG/ACT nasal spray 2 sprays, Nasal, Daily   Multiple Vitamin (MULTIVITAMIN) tablet 1 tablet, Oral, Daily    omeprazole (PRILOSEC) 20 mg, Oral, Every other day   Vitamin D3 2,000 Units, Oral, Daily     Review of Systems    She denies chest pain, dyspnea, pnd, orthopnea, n, v, dizziness, syncope, edema, weight gain, or early satiety. She reports occasional palpitations and dyspnea.   All other systems reviewed and are otherwise negative except as noted above.  Physical Exam    VS:  BP 102/72 (BP Location: Left Arm, Patient Position: Sitting, Cuff Size: Normal)   Pulse (!) 119   Ht _0  (1.575 m)   Wt 151 lb (68.5 kg)   SpO2 98%   BMI 27.62 kg/m  , BMI Body mass index is 27.62 kg/m. GEN: Well nourished, well developed, in no acute distress. HEENT: normal. Neck: Supple, no JVD, carotid bruits, or masses. Cardiac: IR IR and tachycardic, no murmurs, rubs, or gallops. No clubbing, cyanosis, edema.  Radials/DP/PT 2+ and equal bilaterally.  Respiratory:  Respirations regular and unlabored, clear to auscultation bilaterally. GI: Soft, nontender, nondistended, BS + x 4. MS: no deformity or atrophy. Skin: warm and dry, no rash. Neuro:  Strength and sensation are intact. Psych: Normal affect.  Accessory Clinical Findings    ECG personally reviewed by me today -atrial fibrillation with rapid ventricular response, 119 bpm, ST/T changes noted in leads V2 to V6- no acute changes.  VITALS Reviewed today   Temp Readings from Last 3 Encounters:  05/18/21 98.1 F (36.7 C) (Oral)  04/10/21 97.8 F (36.6 C) (Oral)  03/28/21 98 F (36.7 C)   BP Readings from Last 3 Encounters:  06/01/21 102/72  05/18/21 128/76  05/16/21 134/90   Pulse Readings from Last 3 Encounters:  06/01/21 (!) 119  05/18/21 78  05/16/21 (!) 132    Wt Readings from Last 3 Encounters:  06/01/21 151 lb (68.5 kg)  05/18/21 150 lb (68 kg)  05/16/21 152 lb (68.9 kg)     LABS  reviewed today   Lab Results  Component Value Date   WBC 4.9 05/16/2021   HGB 14.4 05/16/2021   HCT 41.5 05/16/2021   MCV 91 05/16/2021    PLT 308 05/16/2021   Lab Results  Component Value Date   CREATININE 0.86 05/16/2021   BUN 14 05/16/2021   NA 142 05/16/2021   K 5.1 05/16/2021   CL 103 05/16/2021   CO2 24 05/16/2021   Lab Results  Component Value Date   ALT 27 05/16/2021   AST 23 05/16/2021   ALKPHOS 97 05/16/2021   BILITOT 0.5 05/16/2021   Lab Results  Component Value Date   CHOL 209 (H) 05/18/2019   HDL 84.50 05/18/2019   LDLCALC 111 (H) 05/18/2019   TRIG 65.0 05/18/2019   CHOLHDL 2 05/18/2019    Lab Results  Component Value Date   HGBA1C 5.8 05/18/2019   Lab Results  Component Value Date   TSH 0.283 (L) 05/16/2021     STUDIES/PROCEDURES reviewed today   Echocardiogram 03/27/2021 Normal LVF.   2. Normal Wall Motion.   3. Left ventricular ejection fraction, by estimation, is 60 to 65%. The  left ventricle has normal function. The left ventricle has  no regional  wall motion abnormalities. Left ventricular diastolic parameters were  normal.   4. Right ventricular systolic function is normal. The right ventricular  size is normal.   5. The mitral valve is normal in structure. Trivial mitral valve  regurgitation.   6. The aortic valve is normal in structure. Aortic valve regurgitation is  mild.   Blood pressure log 05/18/2021 106/77 73 bpm 05/18/2021 110/77 70 bpm 05/19/2021 115/76 66 bpm 05/19/2021 104/63 68 bpm 05/20/2021 90/72 96 bpm 05/20/2021 101/77 110 bpm 05/21/2021 106/87 107 bpm 05/21/2021 115/87 88 bpm 05/22/2021 101/89 86 bpm 05/22/2021 95/82 110 bpm 05/23/2021 100/81 83 bpm 05/25/2021 107/95 106 bpm 05/26/2021 113/101 126 bpm 05/27/2021 103/75 92 bpm 05/28/2021 106/80 105 BPM 05/30/2021 106/82 100 bpm  Assessment & Plan    Persistent atrial fibrillation --Recurrent atrial fibrillation with RVR at 119 bpm s/p DCCV on 05/18/2021.  She reports feeling improvement in symptoms after her cardioversion and is surprised to learn she is back in atrial fibrillation.  She reports  intermittent palpitations and improved shortness of breath with brushing her teeth.  Diltiazem was reduced to 120 mg daily after her cardioversion.  Given her soft BP today, we will keep her at 120 mg with recommendation that she should take an extra diltiazem if her heart rate at rest after 5 minutes is > 120 bpm and SBP above 100.  Continue Eliquis 5 mg twice daily for anticoagulation given CHA2DS2-VASc score of at least 2.  MPI ordered for ischemic evaluation.  Given her failed cardioversion, EP visit scheduled for late January 2023, at which time she would have already met with endocrinology and hopefully s/p normalization of her thyroid function.  She will call the office to update [place] regarding her EP visit timing, if needed.  Her previous labs showed abnormal thyroid with her levothyroxine adjusted.  Given her recurrent atrial fibrillation, we will recheck a BMET and TSH today.  Hypothyroidism --Previous concern expressed that iatrogenic hyperthyroidism may be driving some of her atrial fibrillation.  Recheck a TSH/free T4 today after her most recent levothyroxine adjustment and we have deferred her EP visit for now until after she meets with endocrinology and late January 2023.  Aortic regurgitation --Mild by recent echo.  No further interventions at this time.   Shared Decision Making/Informed Consent The risks [chest pain, shortness of breath, cardiac arrhythmias, dizziness, blood pressure fluctuations, myocardial infarction, stroke/transient ischemic attack, nausea, vomiting, allergic reaction, radiation exposure, metallic taste sensation and life-threatening complications (estimated to be 1 in 10,000)], benefits (risk stratification, diagnosing coronary artery disease, treatment guidance) and alternatives of a nuclear stress test were discussed in detail with Ms. Usman and she agrees to proceed.   Medication changes: None Labs ordered: BMET, TSH, free T4 Studies / Imaging ordered: MPI, EP  visit in late January 2023 (preference to defer EP pending normalization of TSH) Disposition: RTC after MPI  *Please be aware that the above documentation was completed voice recognition software and may contain dictation errors.     Arvil Chaco, PA-C 06/01/2021

## 2021-06-02 LAB — BASIC METABOLIC PANEL
BUN/Creatinine Ratio: 16 (ref 12–28)
BUN: 14 mg/dL (ref 8–27)
CO2: 25 mmol/L (ref 20–29)
Calcium: 9.8 mg/dL (ref 8.7–10.3)
Chloride: 100 mmol/L (ref 96–106)
Creatinine, Ser: 0.88 mg/dL (ref 0.57–1.00)
Glucose: 118 mg/dL — ABNORMAL HIGH (ref 70–99)
Potassium: 4.2 mmol/L (ref 3.5–5.2)
Sodium: 139 mmol/L (ref 134–144)
eGFR: 72 mL/min/{1.73_m2} (ref >59)

## 2021-06-02 LAB — TSH: TSH: 3.99 u[IU]/mL (ref 0.450–4.500)

## 2021-06-02 LAB — T4, FREE: Free T4: 1.41 ng/dL (ref 0.82–1.77)

## 2021-06-04 ENCOUNTER — Telehealth: Payer: Self-pay | Admitting: Internal Medicine

## 2021-06-04 NOTE — Telephone Encounter (Signed)
Left message to schedule follow up appointments from 10/28 visit. Patient needs lexiscan, referral to Dr. Lalla Brothers in the end of January and follow visit afterwards

## 2021-06-11 ENCOUNTER — Ambulatory Visit: Payer: Medicare HMO | Admitting: Adult Health

## 2021-06-14 ENCOUNTER — Telehealth: Payer: Self-pay | Admitting: *Deleted

## 2021-06-14 NOTE — Telephone Encounter (Signed)
-----   Message from Jefferey Pica, RN sent at 06/07/2021  4:51 PM EDT ----- Needs lexiscan  Dr. Lalla Brothers F/u appt  Are these scheduled per checkout 06/01/21 with Jacquelyn? Need to call with lexiscan instructions.

## 2021-06-14 NOTE — Telephone Encounter (Signed)
The patient was seen in office with Alexis Ivan, PA on 06/01/21. Due to the timing of her appointment, she was not able to stop by check out to schedule her testing or follow up appointments as she had somewhere else to be.  I have reviewed the patient's chart and see that she is now scheduled for: 1) lexiscan myoview- Monday 06/18/21 @ 8:30 am 2) Dr. Lalla Brothers- 07/04/21 @ 11:00 am 3) Dr. Okey Dupre- 07/06/21 @ 9:20 am  I had given the patient her written instructions for the myoview while she was in the office, but we did not get to go over these. I advised I would call her to go over her instructions once her test was scheduled.  I have attempted to reach the patient by phone today. No answer- I did leave a detailed message of her instructions on her voice mail as below (ok per DPR).  I asked her to call back with any further questions/ concerns.  [facility] MYOVIEW   Your caregiver has ordered a Stress Test with nuclear imaging. The purpose of this test is to evaluate the blood supply to your heart muscle. This procedure is referred to as a "Non-Invasive Stress Test." This is because other than having an IV started in your vein, nothing is inserted or "invades" your body. Cardiac stress tests are done to find areas of poor blood flow to the heart by determining the extent of coronary artery disease (CAD). Some patients exercise on a treadmill, which naturally increases the blood flow to your heart, while others who are  unable to walk on a treadmill due to physical limitations have a pharmacologic/chemical stress agent called Lexiscan . This medicine will mimic walking on a treadmill by temporarily increasing your coronary blood flow.    Please note: these test may take anywhere between 2-4 hours to complete   PLEASE REPORT TO [facility] ENTR[facility]  THE VOLUNTEERS AT THE FIRST DESK WILL DIRECT YOU WHERE TO GO   Date of Procedure:_____________________________________   Arrival Time for  Procedure:______________________________   Instructions regarding medication:    __x__ : You may take all of your regular morning medications the day of your test with enough water to get them down safely   PLEASE NOTIFY THE OFFICE AT LEAST 24 HOURS IN ADVANCE IF YOU ARE UNABLE TO KEEP YOUR APPOINTMENT.  [phone] AND  PLEASE NOTIFY NUCLEAR MEDICINE AT [facility] AT LEAST 24 HOURS IN ADVANCE IF YOU ARE UNABLE TO KEEP YOUR APPOINTMENT. [phone]   How to prepare for your Myoview test:   Do not eat or drink after midnight No caffeine for 24 hours prior to test No smoking 24 hours prior to test. Your medication may be taken with water.  If your doctor stopped a medication because of this test, do not take that medication. Ladies, please do not wear dresses.  Skirts or pants are appropriate. Please wear a short sleeve shirt. No perfume, cologne or lotion. Wear comfortable walking shoes. No heels!

## 2021-06-15 ENCOUNTER — Other Ambulatory Visit: Payer: Self-pay | Admitting: Internal Medicine

## 2021-06-15 DIAGNOSIS — E039 Hypothyroidism, unspecified: Secondary | ICD-10-CM

## 2021-06-15 NOTE — Telephone Encounter (Signed)
Dr. Serita Kyle note 05/16/21: She notes some difficulty with managing her hypothyroidism with several reductions in her levothyroxine over the last few months.  She is now scheduled to follow-up with endocrinology but could not get an appointment until January.  Follow up TSH was normal on 06/01/21.   Will fill Levothyroxine 100 mcg daily until pt able to follow up with endocrinology in January.

## 2021-06-15 NOTE — Telephone Encounter (Signed)
Please advise if OK to refill or defer to PCP. Thank you! 

## 2021-06-18 ENCOUNTER — Encounter
Admission: RE | Admit: 2021-06-18 | Discharge: 2021-06-18 | Disposition: A | Discharge status: Home / Self Care | Payer: Medicare HMO | Source: Ambulatory Visit | Attending: Physician Assistant | Admitting: Physician Assistant

## 2021-06-18 ENCOUNTER — Other Ambulatory Visit: Payer: Self-pay

## 2021-06-18 DIAGNOSIS — R0600 Dyspnea, unspecified: Secondary | ICD-10-CM

## 2021-06-18 DIAGNOSIS — I4819 Other persistent atrial fibrillation: Secondary | ICD-10-CM

## 2021-06-18 LAB — NM MYOCAR MULTI W/SPECT W/WALL MOTION / EF
Estimated workload: 1
LV dias vol: 75 mL (ref 46–106)
LV sys vol: 36 mL
MPHR: 152 {beats}/min
Nuc Stress EF: 60 %
Peak HR: 109 {beats}/min
Percent HR: 71 %
Rest HR: 78 {beats}/min
Rest Nuclear Isotope Dose: 10.8 mCi
SDS: 0
SRS: 4
SSS: 14
Stress Nuclear Isotope Dose: 33.5 mCi
TID: 1.15

## 2021-06-18 MED ORDER — TECHNETIUM TC 99M TETROFOSMIN IV KIT
30.0000 | PACK | Freq: Once | INTRAVENOUS | Status: AC | PRN
  Administered 2021-06-18: 33.47 via INTRAVENOUS

## 2021-06-18 MED ORDER — TECHNETIUM TC 99M TETROFOSMIN IV KIT
10.0000 | PACK | Freq: Once | INTRAVENOUS | Status: AC | PRN
  Administered 2021-06-18: 10.77 via INTRAVENOUS

## 2021-06-18 MED ORDER — REGADENOSON 0.4 MG/5ML IV SOLN
0.4000 mg | Freq: Once | INTRAVENOUS | Status: AC
  Administered 2021-06-18: 0.4 mg via INTRAVENOUS
  Filled 2021-06-18: qty 5

## 2021-06-20 ENCOUNTER — Telehealth: Payer: Self-pay

## 2021-06-20 NOTE — Telephone Encounter (Signed)
The patient has been notified of the Myoview test result via VM per DPR on file.  Encouraged patient to call back with any questions or concerns.

## 2021-07-04 ENCOUNTER — Encounter: Payer: Self-pay | Admitting: Cardiology

## 2021-07-04 ENCOUNTER — Other Ambulatory Visit: Payer: Self-pay

## 2021-07-04 ENCOUNTER — Ambulatory Visit: Payer: Medicare HMO | Admitting: Cardiology

## 2021-07-04 VITALS — BP 102/72 | HR 64 | Ht 62.0 in | Wt 152.0 lb

## 2021-07-04 DIAGNOSIS — I4819 Other persistent atrial fibrillation: Secondary | ICD-10-CM

## 2021-07-04 DIAGNOSIS — I4891 Unspecified atrial fibrillation: Secondary | ICD-10-CM

## 2021-07-04 NOTE — Patient Instructions (Addendum)
Medication Instructions:  Your physician recommends that you continue on your current medications as directed. Please refer to the Current Medication list given to you today. *If you need a refill on your cardiac medications before your next appointment, please call your pharmacy*  Lab Work: None ordered. If you have labs (blood work) drawn today and your tests are completely normal, you will receive your results only by: [place] (if you have MyChart) OR A paper copy in the mail If you have any lab test that is abnormal or we need to change your treatment, we will call you to review the results.  Testing/Procedures: Your physician has recommended that you have an ablation. Catheter ablation is a medical procedure used to treat some cardiac arrhythmias (irregular heartbeats). During catheter ablation, a long, thin, flexible tube is put into a blood vessel in your groin (upper thigh), or neck. This tube is called an ablation catheter. It is then guided to your heart through the blood vessel. Radio frequency waves destroy small areas of heart tissue where abnormal heartbeats may cause an arrhythmia to start. Please see the instruction sheet given to you today.  Follow-Up:  Your next appointment:    AFIB ablation date October 11, 2021:  Post ablation follow up:  4 weeks with Afib clinic 2.    3 months with Dr. Quentin Ore.  Cardiac Ablation Cardiac ablation is a procedure to destroy, or ablate, a small amount of heart tissue in very specific places. The heart has many electrical connections. Sometimes these connections are abnormal and can cause the heart to beat very fast or irregularly. Ablating some of the areas that cause problems can improve the heart's rhythm or return it to normal. Ablation may be done for people who: Have Wolff-Parkinson-White syndrome. Have fast heart rhythms (tachycardia). Have taken medicines for an abnormal heart rhythm (arrhythmia) that were not effective or  caused side effects. Have a high-risk heartbeat that may be life-threatening. During the procedure, a small incision is made in the neck or the groin, and a long, thin tube (catheter) is inserted into the incision and moved to the heart. Small devices (electrodes) on the tip of the catheter will send out electrical currents. A type of X-ray (fluoroscopy) will be used to help guide the catheter and to provide images of the heart. Tell a health care provider about: Any allergies you have. All medicines you are taking, including vitamins, herbs, eye drops, creams, and over-the-counter medicines. Any problems you or family members have had with anesthetic medicines. Any blood disorders you have. Any surgeries you have had. Any medical conditions you have, such as kidney failure. Whether you are pregnant or may be pregnant. What are the risks? Generally, this is a safe procedure. However, problems may occur, including: Infection. Bruising and bleeding at the catheter insertion site. Bleeding into the chest, especially into the sac that surrounds the heart. This is a serious complication. Stroke or blood clots. Damage to nearby structures or organs. Allergic reaction to medicines or dyes. Need for a permanent pacemaker if the normal electrical system is damaged. A pacemaker is a small computer that sends electrical signals to the heart and helps your heart beat normally. The procedure not being fully effective. This may not be recognized until months later. Repeat ablation procedures are sometimes done. What happens before the procedure? Medicines Ask your health care provider about: Changing or stopping your regular medicines. This is especially important if you are taking diabetes medicines or blood thinners.  Taking medicines such as aspirin and ibuprofen. These medicines can thin your blood. Do not take these medicines unless your health care provider tells you to take them. Taking  over-the-counter medicines, vitamins, herbs, and supplements. General instructions Follow instructions from your health care provider about eating or drinking restrictions. Plan to have someone take you home from the hospital or clinic. If you will be going home right after the procedure, plan to have someone with you for 24 hours. Ask your health care provider what steps will be taken to prevent infection. What happens during the procedure?  An IV will be inserted into one of your veins. You will be given a medicine to help you relax (sedative). The skin on your neck or groin will be numbed. An incision will be made in your neck or your groin. A needle will be inserted through the incision and into a large vein in your neck or groin. A catheter will be inserted into the needle and moved to your heart. Dye may be injected through the catheter to help your surgeon see the area of the heart that needs treatment. Electrical currents will be sent from the catheter to ablate heart tissue in desired areas. There are three types of energy that may be used to do this: Heat (radiofrequency energy). Laser energy. Extreme cold (cryoablation). When the tissue has been ablated, the catheter will be removed. Pressure will be held on the insertion area to prevent a lot of bleeding. A bandage (dressing) will be placed over the insertion area. The exact procedure may vary among health care providers and hospitals. What happens after the procedure? Your blood pressure, heart rate, breathing rate, and blood oxygen level will be monitored until you leave the hospital or clinic. Your insertion area will be monitored for bleeding. You will need to lie still for a few hours to ensure that you do not bleed from the insertion area. Do not drive for 24 hours or as long as told by your health care provider. Summary Cardiac ablation is a procedure to destroy, or ablate, a small amount of heart tissue using an  electrical current. This procedure can improve the heart rhythm or return it to normal. Tell your health care provider about any medical conditions you may have and all medicines you are taking to treat them. This is a safe procedure, but problems may occur. Problems may include infection, bruising, damage to nearby organs or structures, or allergic reactions to medicines. Follow your health care provider's instructions about eating and drinking before the procedure. You may also be told to change or stop some of your medicines. After the procedure, do not drive for 24 hours or as long as told by your health care provider. This information is not intended to replace advice given to you by your health care provider. Make sure you discuss any questions you have with your health care provider. Document Revised: 05/31/2019 Document Reviewed: 05/31/2019 Elsevier Patient Education  2022 [employer].

## 2021-07-04 NOTE — Progress Notes (Signed)
Electrophysiology Office Note:    Date:  07/04/2021   ID:  Alexis Mcintosh, DOB 02-14-1951, MRN [id number]  PCP:  Enid Baas, MD  [facility] Cardiologist:  None  [facility] Electrophysiologist:  Lanier Prude, MD   Referring MD: Lennon Alstrom, PA*   Chief Complaint: Persistent atrial fibrillation  History of Present Illness:    Alexis Mcintosh is a 70 y.o. female who presents for an evaluation of persistent atrial fibrillation at the request of [name], PA-C. Their medical history includes atrial fibrillation, GERD.  Her diagnosis of atrial fibrillation dates back to August 2022 when she was found to be in A. fib by her primary care physician.  She was started on anticoagulation at that time.  She was seen by Dr. Juliann Pares in October 2022.  At that appointment she reported more palpitations and some associated shortness of breath..  Cardioversion was performed on May 18, 2021.  From her previous heart rate log it looks like she stayed in normal rhythm for 48 hours before returning to atrial fibrillation.  She is on Eliquis for stroke prophylaxis. She is symptomatic while in A. fib with palpitations and lightheadedness.  She is interested in a rhythm control strategy that avoids long-term use of medications if possible.    Past Medical History:  Diagnosis Date   Atrial fibrillation (HCC)    GERD (gastroesophageal reflux disease)    Thyroid disease     Past Surgical History:  Procedure Laterality Date   APPENDECTOMY     CARDIOVERSION N/A 05/18/2021   Procedure: CARDIOVERSION;  Surgeon: Antonieta Iba, MD;  Location: [facility] ORS;  Service: Cardiovascular;  Laterality: N/A;    Current Medications: Current Meds  Medication Sig   apixaban (ELIQUIS) 5 MG TABS tablet Take 1 tablet (5 mg total) by mouth 2 (two) times daily.   Calcium Citrate-Vitamin D (CALCIUM CITRATE + D3) 200-250 MG-UNIT TABS Take 1 tablet by mouth daily.   Cholecalciferol (VITAMIN  D3) 2000 units TABS Take 2,000 Units by mouth daily.   Coenzyme Q10 200 MG capsule Take 200 mg by mouth daily.   cycloSPORINE (RESTASIS) 0.05 % ophthalmic emulsion Place 1 drop into both eyes 2 (two) times daily.   desonide (DESOWEN) 0.05 % lotion Apply 1 application topically 2 (two) times daily as needed (irritation).   diltiazem (CARDIZEM CD) 120 MG 24 hr capsule Take 120 mg by mouth daily.   fexofenadine (ALLEGRA) 180 MG tablet Take 1 tablet (180 mg total) by mouth daily.   hydrOXYzine (ATARAX/VISTARIL) 50 MG tablet Take 2 tablets (100 mg total) by mouth at bedtime.   levothyroxine (SYNTHROID) 100 MCG tablet TAKE 1 TABLET BY MOUTH DAILY BEFORE BREAKFAST.   Melatonin 5 MG TABS Take 5 mg by mouth at bedtime.   mometasone (NASONEX) 50 MCG/ACT nasal spray Place 2 sprays into the nose daily. (Patient taking differently: Place 2 sprays into the nose at bedtime as needed (allergies).)   Multiple Vitamin (MULTIVITAMIN) tablet Take 1 tablet by mouth daily.   omeprazole (PRILOSEC) 20 MG capsule Take 20 mg by mouth every other day.     Allergies:   Amoxicillin   Social History   Socioeconomic History   Marital status: Married    Spouse name: Not on file   Number of children: Not on file   Years of education: Not on file   Highest education level: Not on file  Occupational History   Not on file  Tobacco Use   Smoking status: Never  Smokeless tobacco: Never  Vaping Use   Vaping Use: Never used  Substance and Sexual Activity   Alcohol use: Yes    Alcohol/week: 10.0 standard drinks    Types: 10 Glasses of wine per week   Drug use: No   Sexual activity: Never  Other Topics Concern   Not on file  Social History Narrative   Not on file   Social Determinants of Health   Financial Resource Strain: Not on file  Food Insecurity: Not on file  Transportation Needs: Not on file  Physical Activity: Not on file  Stress: Not on file  Social Connections: Not on file     Family  History: The patient's family history includes Atrial fibrillation in her mother; Breast cancer (age of onset: 8) in her mother; Breast cancer (age of onset: 31) in her paternal grandmother; Cancer in her mother; Diabetes in her mother; HIV/AIDS in her brother; Heart disease in her father; Hypertension in her father and mother; Stroke in her father.  ROS:   Please see the history of present illness.    All other systems reviewed and are negative.  EKGs/Labs/Other Studies Reviewed:    The following studies were reviewed today:  March 27, 2021 echo Left ventricular function normal, 60% Right ventricular function normal Trivial MR Mild AI  June 18, 2021 SPECT Low risk study No evidence of ischemia or infarction  March 29, 2021 EKG shows atrial flutter with rapid ventricular rates at 150 bpm      Recent Labs: 05/16/2021: ALT 27; Hemoglobin 14.4; Magnesium 1.9; Platelets 308 06/01/2021: BUN 14; Creatinine, Ser 0.88; Potassium 4.2; Sodium 139; TSH 3.990  Recent Lipid Panel    Component Value Date/Time   CHOL 209 (H) 05/18/2019 0859   TRIG 65.0 05/18/2019 0859   HDL 84.50 05/18/2019 0859   CHOLHDL 2 05/18/2019 0859   VLDL 13.0 05/18/2019 0859   LDLCALC 111 (H) 05/18/2019 0859    Physical Exam:    VS:  BP 102/72 (BP Location: Left Arm, Patient Position: Sitting, Cuff Size: Normal)   Pulse 64   Ht 5\' 2"  (1.575 m)   Wt 152 lb (68.9 kg)   SpO2 96%   BMI 27.80 kg/m     Wt Readings from Last 3 Encounters:  07/04/21 152 lb (68.9 kg)  06/01/21 151 lb (68.5 kg)  05/18/21 150 lb (68 kg)     GEN:  Well nourished, well developed in no acute distress HEENT: Normal NECK: No JVD; No carotid bruits LYMPHATICS: No lymphadenopathy CARDIAC: Irregularly irregular, no murmurs, rubs, gallops RESPIRATORY:  Clear to auscultation without rales, wheezing or rhonchi  ABDOMEN: Soft, non-tender, non-distended MUSCULOSKELETAL:  No edema; No deformity  SKIN: Warm and  dry NEUROLOGIC:  Alert and oriented x 3 PSYCHIATRIC:  Normal affect       ASSESSMENT:    1. Persistent atrial fibrillation (HCC)   2. Atrial fibrillation, unspecified type (HCC)    PLAN:    In order of problems listed above:  #Persistent atrial fibrillation and flutter Symptomatic.  Rhythm control is indicated.  She is on Eliquis for stroke prophylaxis.  We discussed antiarrhythmic drug options including amiodarone and Tikosyn.  We also discussed catheter ablation.  We discussed the details of the procedure including the risks, recovery and likelihood of success.  She would like to proceed with scheduling a catheter ablation.  We also touched on the watchman procedure today given the patient's interest in avoiding long-term exposure to anticoagulation.  I think she would  be a candidate for watchman.  I gave her some information about the procedure and she will let us know if she would like to proceed with left atrial appendage ligation.  For now, we will plan to proceed with ablation scheduling.  Risk, benefits, and alternatives to EP study and radiofrequency ablation for afib were also discussed in detail today. These risks include but are not limited to stroke, bleeding, vascular damage, tamponade, perforation, damage to the esophagus, lungs, and other structures, pulmonary vein stenosis, worsening renal function, and death. The patient understands these risk and wishes to proceed.  We will therefore proceed with catheter ablation at the next available time.  Carto, ICE, anesthesia are requested for the procedure.  Will also obtain CT PV protocol prior to the procedure to exclude LAA thrombus and further evaluate atrial anatomy.  #Hypothyroidism On Synthroid.  Following with primary care physician.   Total time spent with patient today 60 minutes. This includes reviewing records, evaluating the patient and coordinating care.  Medication Adjustments/Labs and Tests Ordered: Current  medicines are reviewed at length with the patient today.  Concerns regarding medicines are outlined above.  Orders Placed This Encounter  Procedures   CT CARDIAC MORPH/PULM VEIN W/CM&W/O CA SCORE   Basic Metabolic Panel (BMET)   CBC w/Diff   No orders of the defined types were placed in this encounter.    Signed, Rossie Muskrat. Lalla Brothers, MD, [facility], [facility] 07/04/2021 11:43 AM    Electrophysiology [facility]

## 2021-07-06 ENCOUNTER — Ambulatory Visit: Payer: Medicare HMO | Admitting: Internal Medicine

## 2021-07-11 DIAGNOSIS — E039 Hypothyroidism, unspecified: Secondary | ICD-10-CM | POA: Diagnosis not present

## 2021-07-13 ENCOUNTER — Other Ambulatory Visit: Payer: Self-pay | Admitting: Family

## 2021-07-13 ENCOUNTER — Other Ambulatory Visit: Payer: Self-pay | Admitting: Internal Medicine

## 2021-07-13 DIAGNOSIS — E039 Hypothyroidism, unspecified: Secondary | ICD-10-CM

## 2021-07-13 NOTE — Telephone Encounter (Signed)
Called and left detailed message on pt's voicemail (ok per DPR to LDM).  Advised pt to request further refills for Levothyroxine from Dr. Nemiah Commander, her PCP.  Dr. Okey Dupre had incr her dose previously d/t abnormal TSH results. See result note 05/16/21.  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  After reviewing pt's chart, she did have recent TSH lab work ordered by Dr. Sylvan Cheese 07/11/21.  Asked pt to call back if she has any further questions.

## 2021-07-13 NOTE — Telephone Encounter (Signed)
Please advise if OK to refill or send to PCP. Thank you!

## 2021-07-16 ENCOUNTER — Other Ambulatory Visit: Payer: Self-pay | Admitting: *Deleted

## 2021-07-16 DIAGNOSIS — I4819 Other persistent atrial fibrillation: Secondary | ICD-10-CM

## 2021-07-16 MED ORDER — APIXABAN 5 MG PO TABS: 5.0000 mg | ORAL_TABLET | Freq: Two times a day (BID) | ORAL | 1 refills | Status: DC

## 2021-07-16 NOTE — Telephone Encounter (Signed)
Eliquis 5mg  paper refill request received. Patient is 70 years old, weight-68.9kg, Crea-0.88 on 06/01/2021, Diagnosis-Afib, and last seen by Dr. 06/03/2021 on 07/04/2021. Dose is appropriate based on dosing criteria. Will send in refill to requested pharmacy.    Last refill was sent on 07/13/2021 with 120 tabs which is a 2 month supply by 14/04/2021, CMA with PCP office. This is not an adequate supply and will fill for 180 tabs with a refill.

## 2021-07-18 DIAGNOSIS — Z23 Encounter for immunization: Secondary | ICD-10-CM | POA: Diagnosis not present

## 2021-07-18 DIAGNOSIS — E039 Hypothyroidism, unspecified: Secondary | ICD-10-CM | POA: Diagnosis not present

## 2021-07-18 DIAGNOSIS — I4819 Other persistent atrial fibrillation: Secondary | ICD-10-CM | POA: Diagnosis not present

## 2021-08-07 ENCOUNTER — Other Ambulatory Visit: Payer: Self-pay

## 2021-08-07 DIAGNOSIS — L509 Urticaria, unspecified: Secondary | ICD-10-CM

## 2021-08-07 MED ORDER — HYDROXYZINE HCL 50 MG PO TABS: 100.0000 mg | ORAL_TABLET | Freq: Every day | ORAL | 3 refills | Status: AC

## 2021-08-10 ENCOUNTER — Telehealth: Payer: Self-pay | Admitting: Internal Medicine

## 2021-08-10 DIAGNOSIS — I499 Cardiac arrhythmia, unspecified: Secondary | ICD-10-CM | POA: Diagnosis not present

## 2021-08-10 DIAGNOSIS — E039 Hypothyroidism, unspecified: Secondary | ICD-10-CM | POA: Diagnosis not present

## 2021-08-10 NOTE — Telephone Encounter (Signed)
Educated the patient about Atrial Fibrillation and the ablation process. Patient said her endocrinologist wasn't sure if the current plan needed to be changed because she is in NSR. Advised to continue as planned and call back when it gets closer to her procedure time if she has any questions.  Verbalized understanding.

## 2021-08-10 NOTE — Telephone Encounter (Signed)
Patient is returning call.  °

## 2021-08-10 NOTE — Telephone Encounter (Signed)
Patient wanted [place] to know she was just seen by her endocrinologist, Dr. Elisabeth Cara, and she performed an EKG and she was in normal rhythm. Please call to discuss.  Dr. Elisabeth Cara states she will forward [place] the EKG. Dr. Elisabeth Cara contact number [phone]

## 2021-08-10 NOTE — Telephone Encounter (Signed)
Left message to call back  

## 2021-08-10 NOTE — Telephone Encounter (Signed)
Attempted to call Dr. Donavan Burnet office back.  On hold for >10 minutes unable to reach anyone.  Please scan EKG for review.  Pt is scheduled for a fib ablation 10/11/21.  Will forward to Dr. Lovena Neighbours nurse.

## 2021-08-20 NOTE — Telephone Encounter (Signed)
-----   Message from Yvonne Kendall, MD sent at 08/20/2021  7:53 AM EST ----- EKG reviewed, now showing NSR.  Will forward to Dr. Lalla Brothers for his review, given plans for a-fib ablation in March.  Thanks.  Thayer Ohm End ----- Message ----- From: Bryna Colander, RN Sent: 08/13/2021   4:23 PM EST To: Yvonne Kendall, MD, Annia Belt, RN   ----- Message ----- From: Norman Herrlich Sent: 08/13/2021   4:10 PM EST To: Mickie Bail Burl Triage

## 2021-09-02 NOTE — Progress Notes (Signed)
Follow-up Outpatient Visit Date: 09/06/2021  Primary Care Provider: Enid Baas, MD [address] [place] [address]  Chief Complaint: Follow-up atrial fibrillation  HPI:  Alexis Mcintosh is a 71 y.o. female with history of persistent atrial fibrillation, thyroid disease, and GERD, who presents for follow-up of atrial fibrillation.  She was noted to be in atrial fibrillation in August when seen by her PCP.  She remained in atrial fibrillation at our initial visit in mid October.  She underwent cardioversion with Dr. Mariah Milling two days later but unfortunately had reverted back to atrial fibrillation by the time she was seen for follow-up in late October.  She was referred to Dr. Lalla Brothers, who is planning to proceed with a-fib ablation next month.  Today, Alexis Mcintosh reports that she is feeling fairly well.  She still has sporadic palpitations, typically every few weeks.  There are no associated symptoms.  She denies chest pain, shortness of breath, lightheadedness, and edema.  She does not have any problems with her medications; she denies bleeding, remaining on apixaban.  --------------------------------------------------------------------------------------------------  Cardiovascular History & Procedures: Cardiovascular Problems: Atrial fibrillation Mild aortic regurgitation   Risk Factors: Age greater than 65   Cath/PCI: None   CV Surgery: None   EP Procedures and Devices: DCCV (05/18/2021)   Non-Invasive Evaluation(s): Pharmacologic MPI (06/18/2021): Low risk study without evidence of ischemia or scar.  Normal LVEF. TTE (03/27/2021): Normal LV size and wall thickness.  LVEF 60-65%.  Normal RV size and function.  Normal biatrial size.  Trivial mitral and tricuspid regurgitation.  Mild aortic regurgitation.  Recent CV Pertinent Labs: Lab Results  Component Value Date   CHOL 209 (H) 05/18/2019   HDL 84.50 05/18/2019   LDLCALC 111 (H) 05/18/2019   TRIG 65.0 05/18/2019    CHOLHDL 2 05/18/2019   K 4.2 06/01/2021   MG 1.9 05/16/2021   BUN 14 06/01/2021   CREATININE 0.88 06/01/2021    Past medical and surgical history were reviewed and updated in EPIC.  Current Meds  Medication Sig   apixaban (ELIQUIS) 5 MG TABS tablet Take 1 tablet (5 mg total) by mouth 2 (two) times daily.   Calcium Citrate-Vitamin D (CALCIUM CITRATE + D3) 200-250 MG-UNIT TABS Take 1 tablet by mouth daily.   Cholecalciferol (VITAMIN D3) 2000 units TABS Take 2,000 Units by mouth daily.   Coenzyme Q10 200 MG capsule Take 200 mg by mouth daily.   cycloSPORINE (RESTASIS) 0.05 % ophthalmic emulsion Place 1 drop into both eyes 2 (two) times daily.   desonide (DESOWEN) 0.05 % lotion Apply 1 application topically 2 (two) times daily as needed (irritation).   diltiazem (CARDIZEM CD) 120 MG 24 hr capsule Take 120 mg by mouth daily.   fexofenadine (ALLEGRA) 180 MG tablet Take 1 tablet (180 mg total) by mouth daily.   hydrOXYzine (ATARAX) 50 MG tablet Take 2 tablets (100 mg total) by mouth at bedtime.   levothyroxine (SYNTHROID) 100 MCG tablet TAKE 1 TABLET BY MOUTH DAILY BEFORE BREAKFAST.   Melatonin 5 MG TABS Take 5 mg by mouth at bedtime.   mometasone (NASONEX) 50 MCG/ACT nasal spray Place 2 sprays into the nose daily.   Multiple Vitamin (MULTIVITAMIN) tablet Take 1 tablet by mouth daily.   omeprazole (PRILOSEC) 20 MG capsule Take 20 mg by mouth every other day.    Allergies: Amoxicillin  Social History   Tobacco Use   Smoking status: Never   Smokeless tobacco: Never  Vaping Use   Vaping Use: Never  used  Substance Use Topics   Alcohol use: Yes    Alcohol/week: 14.0 standard drinks    Types: 14 Glasses of wine per week    Comment: 2 glasses per day   Drug use: No    Family History  Problem Relation Age of Onset   Cancer Mother        Breast   Hypertension Mother    Diabetes Mother    Breast cancer Mother 57   Atrial fibrillation Mother    Heart disease Father         pacemaker   Stroke Father    Hypertension Father    HIV/AIDS Brother    Breast cancer Paternal Grandmother 28    Review of Systems: A 12-system review of systems was performed and was negative except as noted in the HPI.  --------------------------------------------------------------------------------------------------  Physical Exam: BP 100/72 (BP Location: Left Arm, Patient Position: Sitting, Cuff Size: Normal)    Pulse 95    Ht 5\' 2"  (1.575 m)    Wt 152 lb (68.9 kg)    SpO2 98%    BMI 27.80 kg/m   General:  NAD. Neck: No JVD or HJR. Lungs: Clear to auscultation bilaterally without wheezes or crackles. Heart: Irregularly irregular rhythm with 1/6 systolic murmur.  No rubs or gallops. Abdomen: Soft, nontender, nondistended. Extremities: No lower extremity edema.  EKG: Declined by patient  Lab Results  Component Value Date   WBC 4.9 05/16/2021   HGB 14.4 05/16/2021   HCT 41.5 05/16/2021   MCV 91 05/16/2021   PLT 308 05/16/2021    Lab Results  Component Value Date   NA 139 06/01/2021   K 4.2 06/01/2021   CL 100 06/01/2021   CO2 25 06/01/2021   BUN 14 06/01/2021   CREATININE 0.88 06/01/2021   GLUCOSE 118 (H) 06/01/2021   ALT 27 05/16/2021    Lab Results  Component Value Date   CHOL 209 (H) 05/18/2019   HDL 84.50 05/18/2019   LDLCALC 111 (H) 05/18/2019   TRIG 65.0 05/18/2019   CHOLHDL 2 05/18/2019    --------------------------------------------------------------------------------------------------  ASSESSMENT AND PLAN: Persistent atrial fibrillation: I suspect Alexis Mcintosh remains in atrial fibrillation with her irregularly irregular rhythm (she declined EKG today).  Ventricular rate is reasonable today.  We will continue current dose of diltiazem given low normal blood pressure.  Continue apixaban 5 mg twice daily for stroke prevention.  Alexis Mcintosh will proceed with atrial fibrillation ablation followed by possible watchman procedure, as previously discussed  with Dr. Yetta Flock.  Valvular heart disease: Prior echo showed mild aortic regurgitation as well as trivial mitral and tricuspid regurgitation.  No evidence of heart failure on exam today.  Continue clinical follow-up.  Follow-up: Return to clinic in 1 year.  Lalla Brothers, MD 09/06/2021 2:56 PM

## 2021-09-06 ENCOUNTER — Encounter: Payer: Self-pay | Admitting: Internal Medicine

## 2021-09-06 ENCOUNTER — Ambulatory Visit: Payer: Medicare HMO | Admitting: Internal Medicine

## 2021-09-06 ENCOUNTER — Other Ambulatory Visit: Payer: Self-pay

## 2021-09-06 VITALS — BP 100/72 | HR 95 | Ht 62.0 in | Wt 152.0 lb

## 2021-09-06 DIAGNOSIS — I4819 Other persistent atrial fibrillation: Secondary | ICD-10-CM | POA: Diagnosis not present

## 2021-09-06 DIAGNOSIS — I38 Endocarditis, valve unspecified: Secondary | ICD-10-CM

## 2021-09-06 NOTE — Patient Instructions (Signed)
Medication Instructions:  ° °Your physician recommends that you continue on your current medications as directed. Please refer to the Current Medication list given to you today. ° °*If you need a refill on your cardiac medications before your next appointment, please call your pharmacy* ° ° °Lab Work: ° °None ordered ° °Testing/Procedures: ° °None ordered ° ° °Follow-Up: °At [facility], you and your health needs are our priority.  As part of our continuing mission to provide you with exceptional heart care, we have created designated Provider Care Teams.  These Care Teams include your primary Cardiologist (physician) and Advanced Practice Providers (APPs -  Physician Assistants and Nurse Practitioners) who all work together to provide you with the care you need, when you need it. ° °We recommend signing up for the patient portal called "MyChart".  Sign up information is provided on this After Visit Summary.  MyChart is used to connect with patients for Virtual Visits (Telemedicine).  Patients are able to view lab/test results, encounter notes, upcoming appointments, etc.  Non-urgent messages can be sent to your provider as well.   °To learn more about what you can do with MyChart, go to [email or web address].com.   ° °Your next appointment:   °1 year(s) ° °The format for your next appointment:   °In Person ° °Provider:   °You may see Dr. Christopher End or one of the following Advanced Practice Providers on your designated Care Team:   °Christopher Berge, NP °Ryan Dunn, PA-C °Cadence Furth, PA-C ° °

## 2021-09-24 ENCOUNTER — Other Ambulatory Visit: Payer: Self-pay | Admitting: Internal Medicine

## 2021-09-24 ENCOUNTER — Other Ambulatory Visit
Admission: RE | Admit: 2021-09-24 | Discharge: 2021-09-24 | Disposition: A | Discharge status: Home / Self Care | Payer: Medicare HMO | Attending: Cardiology | Admitting: Cardiology

## 2021-09-24 ENCOUNTER — Other Ambulatory Visit (INDEPENDENT_AMBULATORY_CARE_PROVIDER_SITE_OTHER): Payer: Medicare HMO

## 2021-09-24 ENCOUNTER — Telehealth: Payer: Self-pay | Admitting: Internal Medicine

## 2021-09-24 ENCOUNTER — Other Ambulatory Visit: Payer: Self-pay | Admitting: Family

## 2021-09-24 ENCOUNTER — Other Ambulatory Visit: Payer: Self-pay

## 2021-09-24 DIAGNOSIS — I4819 Other persistent atrial fibrillation: Secondary | ICD-10-CM

## 2021-09-24 DIAGNOSIS — I4891 Unspecified atrial fibrillation: Secondary | ICD-10-CM | POA: Diagnosis not present

## 2021-09-24 DIAGNOSIS — E039 Hypothyroidism, unspecified: Secondary | ICD-10-CM

## 2021-09-24 LAB — BASIC METABOLIC PANEL
Anion gap: 8 (ref 5–15)
BUN: 15 mg/dL (ref 8–23)
CO2: 29 mmol/L (ref 22–32)
Calcium: 9.3 mg/dL (ref 8.9–10.3)
Chloride: 101 mmol/L (ref 98–111)
Creatinine, Ser: 0.75 mg/dL (ref 0.44–1.00)
GFR, Estimated: >60 mL/min (ref >60)
Glucose, Bld: 111 mg/dL — ABNORMAL HIGH (ref 70–99)
Potassium: 4.3 mmol/L (ref 3.5–5.1)
Sodium: 138 mmol/L (ref 135–145)

## 2021-09-24 LAB — CBC WITH DIFFERENTIAL/PLATELET
Abs Immature Granulocytes: 0.01 10*3/uL (ref 0.00–0.07)
Basophils Absolute: 0.1 10*3/uL (ref 0.0–0.1)
Basophils Relative: 2 %
Eosinophils Absolute: 0.1 10*3/uL (ref 0.0–0.5)
Eosinophils Relative: 3 %
HCT: 43.8 % (ref 36.0–46.0)
Hemoglobin: 14.2 g/dL (ref 12.0–15.0)
Immature Granulocytes: 0 %
Lymphocytes Relative: 32 %
Lymphs Abs: 1.6 10*3/uL (ref 0.7–4.0)
MCH: 31.6 pg (ref 26.0–34.0)
MCHC: 32.4 g/dL (ref 30.0–36.0)
MCV: 97.6 fL (ref 80.0–100.0)
Monocytes Absolute: 0.4 10*3/uL (ref 0.1–1.0)
Monocytes Relative: 8 %
Neutro Abs: 2.8 10*3/uL (ref 1.7–7.7)
Neutrophils Relative %: 55 %
Platelets: 252 10*3/uL (ref 150–400)
RBC: 4.49 MIL/uL (ref 3.87–5.11)
RDW: 12.5 % (ref 11.5–15.5)
WBC: 4.9 10*3/uL (ref 4.0–10.5)
nRBC: 0 % (ref 0.0–0.2)

## 2021-09-24 MED ORDER — DILTIAZEM HCL ER COATED BEADS 120 MG PO CP24: 120.0000 mg | ORAL_CAPSULE | Freq: Every day | ORAL | 3 refills | Status: DC

## 2021-09-24 NOTE — Telephone Encounter (Signed)
°*  STAT* If patient is at the pharmacy, call can be transferred to refill team.   1. Which medications need to be refilled? (please list name of each medication and dose if known) Cardizem 120mg  daily  2. Which pharmacy/location (including street and city if local pharmacy) is medication to be sent to? [facility] [address]  3. Do they need a 30 day or 90 day supply? 90 day supply

## 2021-09-24 NOTE — Telephone Encounter (Signed)
Requested Prescriptions   Signed Prescriptions Disp Refills   diltiazem (CARDIZEM CD) 120 MG 24 hr capsule 90 capsule 3    Sig: Take 1 capsule (120 mg total) by mouth daily.    Authorizing Provider: END, CHRISTOPHER    Ordering User: Kendrick Fries

## 2021-10-02 ENCOUNTER — Encounter (HOSPITAL_COMMUNITY): Payer: Self-pay

## 2021-10-04 ENCOUNTER — Ambulatory Visit: Admission: RE | Admit: 2021-10-04 | Payer: Medicare HMO | Source: Ambulatory Visit

## 2021-10-04 ENCOUNTER — Telehealth (HOSPITAL_COMMUNITY): Payer: Self-pay | Admitting: *Deleted

## 2021-10-04 NOTE — Telephone Encounter (Signed)
Patient returning call regarding upcoming cardiac imaging study; pt verbalizes understanding of appt date/time, parking situation and where to check in, pre-test NPO status and verified current allergies; name and call back number provided for further questions should they arise ? ?Larey Brick RN Navigator Cardiac Imaging ?[facility] ?[phone] office ?[phone] cell ? ?Patient aware to arrive at 9:30am for her 10am scan. ?

## 2021-10-04 NOTE — Telephone Encounter (Signed)
Attempted to call patient regarding upcoming cardiac CT appointment. °Left message on voicemail with name and callback number ° °Imo Cumbie RN Navigator Cardiac Imaging °[facility] °[phone] Office °[phone] Cell ° °

## 2021-10-05 ENCOUNTER — Other Ambulatory Visit: Payer: Self-pay

## 2021-10-05 ENCOUNTER — Ambulatory Visit (HOSPITAL_COMMUNITY)
Admission: RE | Admit: 2021-10-05 | Discharge: 2021-10-05 | Disposition: A | Discharge status: Home / Self Care | Payer: Medicare HMO | Source: Ambulatory Visit | Attending: Cardiology | Admitting: Cardiology

## 2021-10-05 DIAGNOSIS — I4891 Unspecified atrial fibrillation: Secondary | ICD-10-CM | POA: Insufficient documentation

## 2021-10-05 MED ORDER — IOHEXOL 350 MG/ML SOLN
95.0000 mL | Freq: Once | INTRAVENOUS | Status: AC | PRN
  Administered 2021-10-05: 95 mL via INTRAVENOUS

## 2021-10-06 ENCOUNTER — Other Ambulatory Visit: Payer: Self-pay | Admitting: Family

## 2021-10-06 DIAGNOSIS — J302 Other seasonal allergic rhinitis: Secondary | ICD-10-CM

## 2021-10-10 NOTE — Pre-Procedure Instructions (Signed)
Attempted to call patient regarding procedure instructions.  Left voicemail on the following items: Arrival time 0830 Nothing to eat or drink after midnight No meds AM of procedure Responsible person to drive you home and stay with you for 24 hrs  Have you missed any doses of anti-coagulant Eliquis- take both doses today, none in the morning  

## 2021-10-11 ENCOUNTER — Other Ambulatory Visit (HOSPITAL_COMMUNITY): Payer: Self-pay

## 2021-10-11 ENCOUNTER — Ambulatory Visit (HOSPITAL_COMMUNITY): Payer: Medicare HMO | Admitting: Anesthesiology

## 2021-10-11 ENCOUNTER — Ambulatory Visit (HOSPITAL_COMMUNITY)
Admission: RE | Admit: 2021-10-11 | Discharge: 2021-10-12 | Disposition: A | Discharge status: Home / Self Care | Payer: Medicare HMO | Attending: Cardiology | Admitting: Cardiology

## 2021-10-11 ENCOUNTER — Ambulatory Visit (HOSPITAL_BASED_OUTPATIENT_CLINIC_OR_DEPARTMENT_OTHER): Payer: Medicare HMO | Admitting: Anesthesiology

## 2021-10-11 ENCOUNTER — Encounter (HOSPITAL_COMMUNITY)
Admission: RE | Disposition: A | Discharge status: Home / Self Care | Payer: Medicare HMO | Source: Home / Self Care | Attending: Cardiology

## 2021-10-11 ENCOUNTER — Other Ambulatory Visit: Payer: Self-pay

## 2021-10-11 ENCOUNTER — Encounter (HOSPITAL_COMMUNITY): Payer: Self-pay | Admitting: Cardiology

## 2021-10-11 DIAGNOSIS — I483 Typical atrial flutter: Secondary | ICD-10-CM | POA: Insufficient documentation

## 2021-10-11 DIAGNOSIS — I4891 Unspecified atrial fibrillation: Secondary | ICD-10-CM

## 2021-10-11 DIAGNOSIS — Z79890 Hormone replacement therapy (postmenopausal): Secondary | ICD-10-CM | POA: Insufficient documentation

## 2021-10-11 DIAGNOSIS — I959 Hypotension, unspecified: Secondary | ICD-10-CM | POA: Diagnosis not present

## 2021-10-11 DIAGNOSIS — I4819 Other persistent atrial fibrillation: Secondary | ICD-10-CM | POA: Insufficient documentation

## 2021-10-11 DIAGNOSIS — Z7901 Long term (current) use of anticoagulants: Secondary | ICD-10-CM | POA: Insufficient documentation

## 2021-10-11 DIAGNOSIS — E039 Hypothyroidism, unspecified: Secondary | ICD-10-CM | POA: Insufficient documentation

## 2021-10-11 DIAGNOSIS — K219 Gastro-esophageal reflux disease without esophagitis: Secondary | ICD-10-CM | POA: Diagnosis not present

## 2021-10-11 HISTORY — PX: ATRIAL FIBRILLATION ABLATION: EP1191

## 2021-10-11 LAB — POCT ACTIVATED CLOTTING TIME
Activated Clotting Time: 359 seconds
Activated Clotting Time: 305 seconds
Activated Clotting Time: 359 seconds

## 2021-10-11 SURGERY — ATRIAL FIBRILLATION ABLATION
Anesthesia: General

## 2021-10-11 MED ORDER — HEPARIN SODIUM (PORCINE) 1000 UNIT/ML IJ SOLN
INTRAMUSCULAR | Status: DC | PRN
  Administered 2021-10-11: 1000 [IU] via INTRAVENOUS

## 2021-10-11 MED ORDER — SODIUM CHLORIDE 0.9% FLUSH
3.0000 mL | Freq: Two times a day (BID) | INTRAVENOUS | Status: DC
  Administered 2021-10-11 (×2): 3 mL via INTRAVENOUS

## 2021-10-11 MED ORDER — ONDANSETRON HCL 4 MG/2ML IJ SOLN: 4.0000 mg | Freq: Four times a day (QID) | INTRAMUSCULAR | Status: DC | PRN

## 2021-10-11 MED ORDER — PANTOPRAZOLE SODIUM 40 MG PO TBEC
40.0000 mg | DELAYED_RELEASE_TABLET | Freq: Every day | ORAL | Status: DC
Start: 2021-10-11 — End: 2021-10-12
  Administered 2021-10-11 – 2021-10-12 (×2): 40 mg via ORAL
  Filled 2021-10-11 (×2): qty 1

## 2021-10-11 MED ORDER — PHENYLEPHRINE 40 MCG/ML (10ML) SYRINGE FOR IV PUSH (FOR BLOOD PRESSURE SUPPORT)
PREFILLED_SYRINGE | INTRAVENOUS | Status: DC | PRN
  Administered 2021-10-11: 40 ug via INTRAVENOUS
  Administered 2021-10-11: 80 ug via INTRAVENOUS
  Administered 2021-10-11: 160 ug via INTRAVENOUS
  Administered 2021-10-11: 80 ug via INTRAVENOUS

## 2021-10-11 MED ORDER — PROPOFOL 10 MG/ML IV BOLUS
INTRAVENOUS | Status: DC | PRN
  Administered 2021-10-11: 130 mg via INTRAVENOUS

## 2021-10-11 MED ORDER — LIDOCAINE 2% (20 MG/ML) 5 ML SYRINGE
INTRAMUSCULAR | Status: DC | PRN
  Administered 2021-10-11: 40 mg via INTRAVENOUS

## 2021-10-11 MED ORDER — SUGAMMADEX SODIUM 200 MG/2ML IV SOLN
INTRAVENOUS | Status: DC | PRN
  Administered 2021-10-11: 200 mg via INTRAVENOUS

## 2021-10-11 MED ORDER — SODIUM CHLORIDE 0.9 % IV SOLN: INTRAVENOUS | Status: DC

## 2021-10-11 MED ORDER — HEPARIN (PORCINE) IN NACL 1000-0.9 UT/500ML-% IV SOLN
INTRAVENOUS | Status: DC | PRN
  Administered 2021-10-11 (×4): 500 mL

## 2021-10-11 MED ORDER — FENTANYL CITRATE (PF) 250 MCG/5ML IJ SOLN
INTRAMUSCULAR | Status: DC | PRN
  Administered 2021-10-11 (×2): 50 ug via INTRAVENOUS

## 2021-10-11 MED ORDER — PHENYLEPHRINE HCL-NACL 20-0.9 MG/250ML-% IV SOLN
INTRAVENOUS | Status: DC | PRN
  Administered 2021-10-11: 25 ug/min via INTRAVENOUS

## 2021-10-11 MED ORDER — MIDAZOLAM HCL 5 MG/5ML IJ SOLN
INTRAMUSCULAR | Status: DC | PRN
  Administered 2021-10-11 (×2): 1 mg via INTRAVENOUS

## 2021-10-11 MED ORDER — PANTOPRAZOLE SODIUM 40 MG PO TBEC
40.0000 mg | DELAYED_RELEASE_TABLET | Freq: Every day | ORAL | 0 refills | Status: DC
  Filled 2021-10-11: qty 45, 45d supply, fill #0

## 2021-10-11 MED ORDER — ISOPROTERENOL HCL 0.2 MG/ML IJ SOLN
INTRAMUSCULAR | Status: DC | PRN
  Administered 2021-10-11: 14:00:00 2 ug/min via INTRAVENOUS

## 2021-10-11 MED ORDER — PROTAMINE SULFATE 10 MG/ML IV SOLN
INTRAVENOUS | Status: DC | PRN
  Administered 2021-10-11: 15 mg via INTRAVENOUS
  Administered 2021-10-11 (×2): 10 mg via INTRAVENOUS

## 2021-10-11 MED ORDER — DEXAMETHASONE SODIUM PHOSPHATE 10 MG/ML IJ SOLN
INTRAMUSCULAR | Status: DC | PRN
  Administered 2021-10-11: 10 mg via INTRAVENOUS

## 2021-10-11 MED ORDER — SODIUM CHLORIDE 0.9 % IV SOLN
250.0000 mL | INTRAVENOUS | Status: DC | PRN
Start: 2021-10-11 — End: 2021-10-12

## 2021-10-11 MED ORDER — HEPARIN (PORCINE) IN NACL 1000-0.9 UT/500ML-% IV SOLN
INTRAVENOUS | Status: AC
  Filled 2021-10-11: qty 2000

## 2021-10-11 MED ORDER — APIXABAN 5 MG PO TABS
5.0000 mg | ORAL_TABLET | Freq: Two times a day (BID) | ORAL | Status: DC
  Administered 2021-10-11 – 2021-10-12 (×2): 5 mg via ORAL
  Filled 2021-10-11 (×2): qty 1

## 2021-10-11 MED ORDER — SODIUM CHLORIDE 0.9% FLUSH: 3.0000 mL | INTRAVENOUS | Status: DC | PRN

## 2021-10-11 MED ORDER — ONDANSETRON HCL 4 MG/2ML IJ SOLN
INTRAMUSCULAR | Status: DC | PRN
Start: 2021-10-11 — End: 2021-10-11
  Administered 2021-10-11: 4 mg via INTRAVENOUS

## 2021-10-11 MED ORDER — COLCHICINE 0.6 MG PO TABS
0.6000 mg | ORAL_TABLET | Freq: Two times a day (BID) | ORAL | 0 refills | Status: DC
  Filled 2021-10-11: qty 10, 5d supply, fill #0

## 2021-10-11 MED ORDER — ACETAMINOPHEN 325 MG PO TABS
650.0000 mg | ORAL_TABLET | ORAL | Status: DC | PRN
  Filled 2021-10-11: qty 2

## 2021-10-11 MED ORDER — ROCURONIUM BROMIDE 10 MG/ML (PF) SYRINGE
PREFILLED_SYRINGE | INTRAVENOUS | Status: DC | PRN
Start: 2021-10-11 — End: 2021-10-11
  Administered 2021-10-11 (×2): 20 mg via INTRAVENOUS
  Administered 2021-10-11: 60 mg via INTRAVENOUS

## 2021-10-11 MED ORDER — COLCHICINE 0.6 MG PO TABS
0.6000 mg | ORAL_TABLET | Freq: Two times a day (BID) | ORAL | Status: DC
  Administered 2021-10-11 – 2021-10-12 (×2): 0.6 mg via ORAL
  Filled 2021-10-11 (×2): qty 1

## 2021-10-11 MED ORDER — HEPARIN SODIUM (PORCINE) 1000 UNIT/ML IJ SOLN
INTRAMUSCULAR | Status: AC
  Filled 2021-10-11: qty 10

## 2021-10-11 MED ORDER — HEPARIN SODIUM (PORCINE) 1000 UNIT/ML IJ SOLN
INTRAMUSCULAR | Status: DC | PRN
Start: 2021-10-11 — End: 2021-10-11
  Administered 2021-10-11: 3000 [IU] via INTRAVENOUS
  Administered 2021-10-11: 11000 [IU] via INTRAVENOUS

## 2021-10-11 MED ORDER — ISOPROTERENOL HCL 0.2 MG/ML IJ SOLN
INTRAMUSCULAR | Status: AC
  Filled 2021-10-11: qty 5

## 2021-10-11 SURGICAL SUPPLY — 18 items
CATH 8FR REPROCESSED SOUNDSTAR (CATHETERS) ×2 IMPLANT
CATH 8FR SOUNDSTAR REPROCESSED (CATHETERS) IMPLANT
CATH OCTARAY 2.0 F 3-3-3-3-3 (CATHETERS) ×1 IMPLANT
CATH S CIRCA THERM PROBE 10F (CATHETERS) ×1 IMPLANT
CATH SMTCH THERMOCOOL SF DF (CATHETERS) ×1 IMPLANT
CATH WEBSTER BI DIR CS D-F CRV (CATHETERS) ×1 IMPLANT
CLOSURE PERCLOSE PROSTYLE (VASCULAR PRODUCTS) ×3 IMPLANT
COVER SWIFTLINK CONNECTOR (BAG) ×2 IMPLANT
PACK EP LATEX FREE (CUSTOM PROCEDURE TRAY) ×1
PACK EP LF (CUSTOM PROCEDURE TRAY) ×1 IMPLANT
PAD DEFIB RADIO PHYSIO CONN (PAD) ×2 IMPLANT
PATCH CARTO3 (PAD) ×1 IMPLANT
SHEATH BAYLIS TRANSSEPTAL 98CM (NEEDLE) ×1 IMPLANT
SHEATH CARTO VIZIGO SM CVD (SHEATH) ×1 IMPLANT
SHEATH PINNACLE 8F 10CM (SHEATH) ×2 IMPLANT
SHEATH PINNACLE 9F 10CM (SHEATH) ×1 IMPLANT
SHEATH PROBE COVER 6X72 (BAG) ×1 IMPLANT
TUBING SMART ABLATE COOLFLOW (TUBING) ×1 IMPLANT

## 2021-10-11 NOTE — H&P (Signed)
Electrophysiology Office Note:     Date:  10/11/2021    ID:  Alexis Mcintosh, DOB 19-Sep-1950, MRN [id number]   PCP:  Enid Baas, MD         [facility] Cardiologist:  None  [facility] Electrophysiologist:  Lanier Prude, MD    Referring MD: Lennon Alstrom, PA*    Chief Complaint: Persistent atrial fibrillation   History of Present Illness:     Alexis Mcintosh is a 71 y.o. female who presents for an evaluation of persistent atrial fibrillation at the request of Alexis Foots, PA-C. Their medical history includes atrial fibrillation, GERD.  Her diagnosis of atrial fibrillation dates back to August 2022 when she was found to be in A. fib by her primary care physician.  She was started on anticoagulation at that time.  She was seen by Dr. Juliann Pares in October 2022.  At that appointment she reported more palpitations and some associated shortness of breath..  Cardioversion was performed on May 18, 2021.  From her previous heart rate log it looks like she stayed in normal rhythm for 48 hours before returning to atrial fibrillation.  She is on Eliquis for stroke prophylaxis. She is symptomatic while in A. fib with palpitations and lightheadedness.  She is interested in a rhythm control strategy that avoids long-term use of medications if possible.     Objective        Past Medical History:  Diagnosis Date   Atrial fibrillation (HCC)     GERD (gastroesophageal reflux disease)     Thyroid disease             Past Surgical History:  Procedure Laterality Date   APPENDECTOMY       CARDIOVERSION N/A 05/18/2021    Procedure: CARDIOVERSION;  Surgeon: Antonieta Iba, MD;  Location: [facility] ORS;  Service: Cardiovascular;  Laterality: N/A;      Current Medications: Active Medications      Current Meds  Medication Sig   apixaban (ELIQUIS) 5 MG TABS tablet Take 1 tablet (5 mg total) by mouth 2 (two) times daily.   Calcium Citrate-Vitamin D (CALCIUM CITRATE + D3)  200-250 MG-UNIT TABS Take 1 tablet by mouth daily.   Cholecalciferol (VITAMIN D3) 2000 units TABS Take 2,000 Units by mouth daily.   Coenzyme Q10 200 MG capsule Take 200 mg by mouth daily.   cycloSPORINE (RESTASIS) 0.05 % ophthalmic emulsion Place 1 drop into both eyes 2 (two) times daily.   desonide (DESOWEN) 0.05 % lotion Apply 1 application topically 2 (two) times daily as needed (irritation).   diltiazem (CARDIZEM CD) 120 MG 24 hr capsule Take 120 mg by mouth daily.   fexofenadine (ALLEGRA) 180 MG tablet Take 1 tablet (180 mg total) by mouth daily.   hydrOXYzine (ATARAX/VISTARIL) 50 MG tablet Take 2 tablets (100 mg total) by mouth at bedtime.   levothyroxine (SYNTHROID) 100 MCG tablet TAKE 1 TABLET BY MOUTH DAILY BEFORE BREAKFAST.   Melatonin 5 MG TABS Take 5 mg by mouth at bedtime.   mometasone (NASONEX) 50 MCG/ACT nasal spray Place 2 sprays into the nose daily. (Patient taking differently: Place 2 sprays into the nose at bedtime as needed (allergies).)   Multiple Vitamin (MULTIVITAMIN) tablet Take 1 tablet by mouth daily.   omeprazole (PRILOSEC) 20 MG capsule Take 20 mg by mouth every other day.        Allergies:   Amoxicillin    Social History         Socioeconomic  History   Marital status: Married      Spouse name: Not on file   Number of children: Not on file   Years of education: Not on file   Highest education level: Not on file  Occupational History   Not on file  Tobacco Use   Smoking status: Never   Smokeless tobacco: Never  Vaping Use   Vaping Use: Never used  Substance and Sexual Activity   Alcohol use: Yes      Alcohol/week: 10.0 standard drinks      Types: 10 Glasses of wine per week   Drug use: No   Sexual activity: Never  Other Topics Concern   Not on file  Social History Narrative   Not on file    Social Determinants of Health    Financial Resource Strain: Not on file  Food Insecurity: Not on file  Transportation Needs: Not on file  Physical  Activity: Not on file  Stress: Not on file  Social Connections: Not on file      Family History: The patient's family history includes Atrial fibrillation in her mother; Breast cancer (age of onset: 8) in her mother; Breast cancer (age of onset: 27) in her paternal grandmother; Cancer in her mother; Diabetes in her mother; HIV/AIDS in her brother; Heart disease in her father; Hypertension in her father and mother; Stroke in her father.   ROS:   Please see the history of present illness.    All other systems reviewed and are negative.   EKGs/Labs/Other Studies Reviewed:     The following studies were reviewed today:   March 27, 2021 echo Left ventricular function normal, 60% Right ventricular function normal Trivial MR Mild AI   June 18, 2021 SPECT Low risk study No evidence of ischemia or infarction   March 29, 2021 EKG shows atrial flutter with rapid ventricular rates at 150 bpm          Recent Labs: 05/16/2021: ALT 27; Hemoglobin 14.4; Magnesium 1.9; Platelets 308 06/01/2021: BUN 14; Creatinine, Ser 0.88; Potassium 4.2; Sodium 139; TSH 3.990  Recent Lipid Panel Labs (Brief)          Component Value Date/Time    CHOL 209 (H) 05/18/2019 0859    TRIG 65.0 05/18/2019 0859    HDL 84.50 05/18/2019 0859    CHOLHDL 2 05/18/2019 0859    VLDL 13.0 05/18/2019 0859    LDLCALC 111 (H) 05/18/2019 0859        Physical Exam:     VS:  BP 102/72 (BP Location: Left Arm, Patient Position: Sitting, Cuff Size: Normal)    Pulse 64    Ht 5\' 2"  (1.575 m)    Wt 152 lb (68.9 kg)    SpO2 96%    BMI 27.80 kg/m         Wt Readings from Last 3 Encounters:  07/04/21 152 lb (68.9 kg)  06/01/21 151 lb (68.5 kg)  05/18/21 150 lb (68 kg)      GEN:  Well nourished, well developed in no acute distress HEENT: Normal NECK: No JVD; No carotid bruits LYMPHATICS: No lymphadenopathy CARDIAC: Irregularly irregular, no murmurs, rubs, gallops RESPIRATORY:  Clear to auscultation without  rales, wheezing or rhonchi  ABDOMEN: Soft, non-tender, non-distended MUSCULOSKELETAL:  No edema; No deformity  SKIN: Warm and dry NEUROLOGIC:  Alert and oriented x 3 PSYCHIATRIC:  Normal affect          Assessment     ASSESSMENT:  1. Persistent atrial fibrillation (HCC)   2. Atrial fibrillation, unspecified type (HCC)     PLAN:     In order of problems listed above:   #Persistent atrial fibrillation and flutter Symptomatic.  Rhythm control is indicated.  She is on Eliquis for stroke prophylaxis.  We discussed antiarrhythmic drug options including amiodarone and Tikosyn.  We also discussed catheter ablation.  We discussed the details of the procedure including the risks, recovery and likelihood of success.  She would like to proceed with scheduling a catheter ablation.  We also touched on the watchman procedure today given the patient's interest in avoiding long-term exposure to anticoagulation.  I think she would be a candidate for watchman.  I gave her some information about the procedure and she will let us know if she would like to proceed with left atrial appendage ligation.  For now, we will plan to proceed with ablation scheduling.   Risk, benefits, and alternatives to EP study and radiofrequency ablation for afib were also discussed in detail today. These risks include but are not limited to stroke, bleeding, vascular damage, tamponade, perforation, damage to the esophagus, lungs, and other structures, pulmonary vein stenosis, worsening renal function, and death. The patient understands these risk and wishes to proceed.  We will therefore proceed with catheter ablation at the next available time.  Carto, ICE, anesthesia are requested for the procedure.  Will also obtain CT PV protocol prior to the procedure to exclude LAA thrombus and further evaluate atrial anatomy.   #Hypothyroidism On Synthroid.  Following with primary care physician.          Signed, Rossie Muskratameron T.  Lalla BrothersLambert, MD, [facility] LL[facility]ACC, [facility] Electrophysiology [facility]    --------------------------  I have seen, examined the patient, and reviewed the above assessment and plan.    Plan for PVI today. Procedure reviewed.   Lanier PrudeAMERON T Macklin Jacquin, MD 10/11/2021 11:07 AM

## 2021-10-11 NOTE — Progress Notes (Signed)
Pt became hypotensive and Dr Lalla Brothers came in to recovery to see patient.  Ultrasound was done by Lalla Brothers with report that everything to be WNL.  Bolus was given and Dr. Maple Hudson covering for Dr. Sampson Goon was called as well to give an update .  No new order given since Dr Lalla Brothers had check patient.  Pt was admitted to [address].   VSS at this time 105/49, 59 HR, 12 resp, 100% RA and no complaints of discomfort. ?

## 2021-10-11 NOTE — Anesthesia Procedure Notes (Addendum)
Procedure Name: Intubation ?Date/Time: 10/11/2021 12:21 PM ?Performed by: Waynard Edwards, CRNA ?Pre-anesthesia Checklist: Patient identified, Emergency Drugs available, Suction available and Patient being monitored ?Patient Re-evaluated:Patient Re-evaluated prior to induction ?Oxygen Delivery Method: Circle system utilized ?Preoxygenation: Pre-oxygenation with 100% oxygen ?Induction Type: IV induction ?Ventilation: Mask ventilation without difficulty ?Laryngoscope Size: Hyacinth Meeker and 2 ?Grade View: Grade I ?Tube type: Oral ?Tube size: 7.0 mm ?Number of attempts: 1 ?Airway Equipment and Method: Stylet ?Placement Confirmation: ETT inserted through vocal cords under direct vision, positive ETCO2 and breath sounds checked- equal and bilateral ?Secured at: 21 cm ?Tube secured with: Tape ?Dental Injury: Teeth and Oropharynx as per pre-operative assessment  ? ? ? ? ?

## 2021-10-11 NOTE — Anesthesia Preprocedure Evaluation (Addendum)
Anesthesia Evaluation  ?Patient identified by MRN, date of birth, ID band ?Patient awake ? ? ? ?Reviewed: ?Allergy & Precautions, NPO status , Patient's Chart, lab work & pertinent test results ? ?Airway ?Mallampati: II ? ?TM Distance: >3 FB ?Neck ROM: Full ? ? ? Dental ? ?(+) Dental Advisory Given ?  ?Pulmonary ?neg pulmonary ROS,  ?  ?breath sounds clear to auscultation ? ? ? ? ? ? Cardiovascular ?+ dysrhythmias Atrial Fibrillation  ?Rhythm:Regular Rate:Normal ? ?TTE 2022 ??1. Normal LVF.  ??2. Normal Wall Motion.  ??3. Left ventricular ejection fraction, by estimation, is 60 to 65%. The  ?left ventricle has normal function. The left ventricle has no regional  ?wall motion abnormalities. Left ventricular diastolic parameters were  ?normal.  ??4. Right ventricular systolic function is normal. The right ventricular  ?size is normal.  ??5. The mitral valve is normal in structure. Trivial mitral valve  ?regurgitation.  ??6. The aortic valve is normal in structure. Aortic valve regurgitation is  ?mild.  ?  ?Neuro/Psych ?negative neurological ROS ?   ? GI/Hepatic ?Neg liver ROS, GERD  ,  ?Endo/Other  ?Hypothyroidism  ? Renal/GU ?negative Renal ROS  ? ?  ?Musculoskeletal ? ?(+) Arthritis ,  ? Abdominal ?  ?Peds ? Hematology ?negative hematology ROS ?(+)   ?Anesthesia Other Findings ? ? Reproductive/Obstetrics ? ?  ? ? ? ? ? ? ? ? ? ? ? ? ? ?  ?  ? ? ? ? ? ? ? ?Anesthesia Physical ?Anesthesia Plan ? ?ASA: 2 ? ?Anesthesia Plan: General  ? ?Post-op Pain Management: Tylenol PO (pre-op)* and Minimal or no pain anticipated  ? ?Induction: Intravenous ? ?PONV Risk Score and Plan: 3 and Dexamethasone, Ondansetron and Treatment may vary due to age or medical condition ? ?Airway Management Planned: Oral ETT ? ?Additional Equipment: None ? ?Intra-op Plan:  ? ?Post-operative Plan: Extubation in OR ? ?Informed Consent: I have reviewed the patients History and Physical, chart, labs and discussed the  procedure including the risks, benefits and alternatives for the proposed anesthesia with the patient or authorized representative who has indicated his/her understanding and acceptance.  ? ? ? ?Dental advisory given ? ?Plan Discussed with: CRNA ? ?Anesthesia Plan Comments:   ? ? ? ? ? ? ?Anesthesia Quick Evaluation ? ?

## 2021-10-11 NOTE — Transfer of Care (Signed)
Immediate Anesthesia Transfer of Care Note ? ?Patient: Alexis Mcintosh ? ?Procedure(s) Performed: ATRIAL FIBRILLATION ABLATION ? ?Patient Location: PACU ? ?Anesthesia Type:General ? ?Level of Consciousness: awake, oriented and patient cooperative ? ?Airway & Oxygen Therapy: Patient Spontanous Breathing and Patient connected to nasal cannula oxygen ? ?Post-op Assessment: Report given to RN and Post -op Vital signs reviewed and stable ? ?Post vital signs: Reviewed ? ?Last Vitals:  ?Vitals Value Taken Time  ?BP 95/50 10/11/21 1459  ?Temp    ?Pulse 55 10/11/21 1501  ?Resp 16 10/11/21 1501  ?SpO2 96 % 10/11/21 1501  ?Vitals shown include unvalidated device data. ? ?Last Pain:  ?Vitals:  ? 10/11/21 0830  ?TempSrc: Oral  ?   ? ?  ? ?Complications: No notable events documented. ?

## 2021-10-12 ENCOUNTER — Other Ambulatory Visit (HOSPITAL_COMMUNITY): Payer: Self-pay

## 2021-10-12 ENCOUNTER — Encounter (HOSPITAL_COMMUNITY): Payer: Self-pay | Admitting: Cardiology

## 2021-10-12 DIAGNOSIS — Z79890 Hormone replacement therapy (postmenopausal): Secondary | ICD-10-CM | POA: Diagnosis not present

## 2021-10-12 DIAGNOSIS — E039 Hypothyroidism, unspecified: Secondary | ICD-10-CM | POA: Diagnosis not present

## 2021-10-12 DIAGNOSIS — I4819 Other persistent atrial fibrillation: Secondary | ICD-10-CM | POA: Diagnosis not present

## 2021-10-12 DIAGNOSIS — I959 Hypotension, unspecified: Secondary | ICD-10-CM | POA: Diagnosis not present

## 2021-10-12 DIAGNOSIS — I483 Typical atrial flutter: Secondary | ICD-10-CM | POA: Diagnosis not present

## 2021-10-12 DIAGNOSIS — Z7901 Long term (current) use of anticoagulants: Secondary | ICD-10-CM | POA: Diagnosis not present

## 2021-10-12 DIAGNOSIS — K219 Gastro-esophageal reflux disease without esophagitis: Secondary | ICD-10-CM | POA: Diagnosis not present

## 2021-10-12 MED ORDER — ACETAMINOPHEN 325 MG PO TABS: 650.0000 mg | ORAL_TABLET | ORAL | Status: AC | PRN

## 2021-10-12 MED ORDER — NIFEDIPINE ER 30 MG PO TB24
30.0000 mg | ORAL_TABLET | Freq: Every day | ORAL | 11 refills | Status: DC
  Filled 2021-10-12: qty 30, 30d supply, fill #0

## 2021-10-12 MED ORDER — PANTOPRAZOLE SODIUM 40 MG PO TBEC
40.0000 mg | DELAYED_RELEASE_TABLET | Freq: Every day | ORAL | 0 refills | Status: DC
  Filled 2021-10-12: qty 45, 45d supply, fill #0

## 2021-10-12 MED ORDER — COLCHICINE 0.6 MG PO TABS
0.6000 mg | ORAL_TABLET | Freq: Two times a day (BID) | ORAL | 0 refills | Status: DC
  Filled 2021-10-12: qty 10, 5d supply, fill #0

## 2021-10-12 NOTE — Anesthesia Postprocedure Evaluation (Signed)
Anesthesia Post Note ? ?Patient: Alexis Mcintosh ? ?Procedure(s) Performed: ATRIAL FIBRILLATION ABLATION ? ?  ? ?Patient location during evaluation: Other ?Anesthesia Type: General ?Level of consciousness: awake and alert ?Pain management: pain level controlled ?Vital Signs Assessment: post-procedure vital signs reviewed and stable ?Respiratory status: spontaneous breathing, nonlabored ventilation and respiratory function stable ?Cardiovascular status: blood pressure returned to baseline and stable ?Postop Assessment: no apparent nausea or vomiting ?Anesthetic complications: no ? ? ?No notable events documented. ? ?Last Vitals:  ?Vitals:  ? 10/12/21 0019 10/12/21 0446  ?BP: 122/70 119/71  ?Pulse: 77 69  ?Resp: 14 16  ?Temp: 37.1 ?C 37.1 ?C  ?SpO2: 96% 95%  ?  ?Last Pain:  ?Vitals:  ? 10/12/21 0446  ?TempSrc: Oral  ?PainSc:   ? ? ?  ?  ?  ?  ?  ?  ? ?Sterling Mondo,W. EDMOND ? ? ? ? ?

## 2021-10-12 NOTE — Discharge Summary (Addendum)
? ? ?ELECTROPHYSIOLOGY PROCEDURE DISCHARGE SUMMARY  ? ? ?Patient ID: Alexis Mcintosh,  ?MRN: [id number], DOB/AGE: Mar 13, 1951 71 y.o. ? ?Admit date: 10/11/2021 ?Discharge date: 10/12/2021 ? ?Primary Care Physician: Gladstone Lighter, MD  ?Primary Cardiologist: Nelva Bush, MD  ?Electrophysiologist: Dr. Quentin Ore ? ?Primary Discharge Diagnosis:  ?Atrial Fibrillation ? ?Secondary Discharge Diagnosis:  ?Hypotension ? ?Procedures This Admission:  ?1.  Electrophysiology study and radiofrequency catheter ablation of Atrial Fibrillation on 10/11/2021 by  Dr. Quentin Ore . ?This study demonstrated: ?1. Successful PVI ?2. Successful ablation/isolation of the posterior wall ?3. 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 Intracardiac echo reveals trivial pericardial effusion 4 PV's and a small pouch anterior to the RSPV ?5. No early apparent complications. ?6. Colchicine 0.6mg  PO BID x 5 days ?7. Protonix 40mg  PO daily x 45 days ? ?Brief HPI: ?Alexis Mcintosh is a 71 y.o. female with a history of persistent Atrial Fibrillation.  Risks, benefits, and alternatives to catheter ablation of Atrial Fibrillation were reviewed with the patient who wished to proceed.  The patient underwent had not missed any of her eliquis for stroke prophylaxis prior to procedure, and did not require TEE. Intracardiac echo as above.  ? ?Hospital Course:  ?The patient was admitted and underwent EPS/RFCA of Atrial Fibrillation with details as outlined above. She had hypotension after the case resulting in overnight observation. This improved with gently fluids and po intake. They were monitored on telemetry overnight which demonstrated NSR.  Groin was without complication on the day of discharge.  The patient was examined and considered to be stable for discharge.  Wound care and restrictions were reviewed with the patient.  The patient will be seen back by Roderic Palau, NP in 4 weeks and  Dr. Quentin Ore  in 12 weeks for post  ablation follow up.  ? ?This patients CHA2DS2-VASc Score and unadjusted Ischemic Stroke Rate (% per year) is equal to 2.2 % stroke rate/year from a score of 2 ?Above score calculated as 1 point each if present [CHF, HTN, DM, Vascular=MI/PAD/Aortic Plaque, Age if 39-74, or Female] ?Above score calculated as 2 points each if present [Age > 75, or Stroke/TIA/TE]  ? ? ?Physical Exam: ?Vitals:  ? 10/11/21 1908 10/11/21 1938 10/12/21 0019 10/12/21 0446  ?BP: 118/72 114/64 122/70 119/71  ?Pulse: 76 72 77 69  ?Resp:   14 16  ?Temp:   98.7 ?F (37.1 ?C) 98.7 ?F (37.1 ?C)  ?TempSrc:   Oral Oral  ?SpO2: 96% 94% 96% 95%  ?Weight:      ?Height:      ? ? ?GEN- The patient is well appearing, alert and oriented x 3 today.   ?HEENT: normocephalic, atraumatic; sclera clear, conjunctiva pink; hearing intact; oropharynx clear; neck supple  ?Lungs- Clear to ausculation bilaterally, normal work of breathing.  No wheezes, rales, rhonchi ?Heart- Regular rate and rhythm, no murmurs, rubs or gallops  ?GI- soft, non-tender, non-distended, bowel sounds present  ?Extremities- no clubbing, cyanosis, or edema; DP/PT/radial pulses 2+ bilaterally, groin without hematoma/bruit ?MS- no significant deformity or atrophy ?Skin- warm and dry, no rash or lesion ?Psych- euthymic mood, full affect ?Neuro- strength and sensation are intact ? ? ?Labs: ?  ?Lab Results  ?Component Value Date  ? WBC 4.9 09/24/2021  ? HGB 14.2 09/24/2021  ? HCT 43.8 09/24/2021  ? MCV 97.6 09/24/2021  ? PLT 252 09/24/2021  ? No results for input(s): NA, K, CL, CO2, BUN, CREATININE, CALCIUM, PROT, BILITOT, ALKPHOS, ALT, AST, GLUCOSE in the  last 168 hours. ? ?Invalid input(s): LABALBU ? ? ?Discharge Medications:  ?Allergies as of 10/12/2021   ? ?   Reactions  ? Amoxicillin Hives  ? ?  ? ?  ?Medication List  ?  ? ?STOP taking these medications   ? ?diltiazem 120 MG 24 hr capsule ?Commonly known as: CARDIZEM CD ?  ?omeprazole 20 MG capsule ?Commonly known as: PRILOSEC ?  ? ?  ? ?TAKE  these medications   ? ?acetaminophen 325 MG tablet ?Commonly known as: TYLENOL ?Take 2 tablets (650 mg total) by mouth every 4 (four) hours as needed for headache or mild pain. ?  ?apixaban 5 MG Tabs tablet ?Commonly known as: Eliquis ?Take 1 tablet (5 mg total) by mouth 2 (two) times daily. ?  ?Calcium Citrate + D3 200-6.25 MG-MCG Tabs ?Generic drug: Calcium Citrate-Vitamin D ?Take 1 tablet by mouth daily. ?  ?Coenzyme Q10 200 MG capsule ?Take 200 mg by mouth daily. ?  ?colchicine 0.6 MG tablet ?Take 1 tablet (0.6 mg total) by mouth 2 (two) times daily for 5 days. ?  ?cycloSPORINE 0.05 % ophthalmic emulsion ?Commonly known as: RESTASIS ?Place 1 drop into both eyes 2 (two) times daily. ?  ?desonide 0.05 % lotion ?Commonly known as: DESOWEN ?Apply 1 application. topically 2 (two) times daily as needed (Eczema). ?  ?fexofenadine 180 MG tablet ?Commonly known as: ALLEGRA ?Take 1 tablet (180 mg total) by mouth daily. ?  ?hydrOXYzine 50 MG tablet ?Commonly known as: ATARAX ?Take 2 tablets (100 mg total) by mouth at bedtime. ?  ?levothyroxine 100 MCG tablet ?Commonly known as: SYNTHROID ?TAKE 1 TABLET BY MOUTH DAILY BEFORE BREAKFAST. ?  ?melatonin 5 MG Tabs ?Take 10 mg by mouth at bedtime. ?  ?mometasone 50 MCG/ACT nasal spray ?Commonly known as: NASONEX ?PLACE 2 SPRAYS INTO THE NOSE DAILY. ?  ?multivitamin tablet ?Take 1 tablet by mouth daily. ?  ?NIFEdipine 30 MG 24 hr tablet ?Commonly known as: PROCARDIA-XL/NIFEDICAL-XL ?Take 1 tablet (30 mg total) by mouth daily. ?  ?pantoprazole 40 MG tablet ?Commonly known as: Protonix ?Take 1 tablet (40 mg total) by mouth daily. ?  ?tacrolimus 0.1 % ointment ?Commonly known as: PROTOPIC ?Apply 1 application. topically daily as needed (Eczema). ?  ?Vitamin D3 50 MCG (2000 UT) Tabs ?Take 2,000 Units by mouth daily. ?  ? ?  ? ? ?Disposition:  ? ? Follow-up Information   ? ? [place] Follow up.   ?Specialty: Cardiology ?Why: on 4/6 at 1130 for post ablation  care ?Contact information: ?[address] ?[id number] ?[place] ?[phone] ? ?  ?  ? ?  ?  ? ?  ? ? ?Duration of Discharge Encounter: Greater than 30 minutes including physician time. ? ?Signed, ?Shirley Friar, PA-C  ?10/12/2021 ?7:52 AM ? ? ? ?

## 2021-10-12 NOTE — Discharge Instructions (Signed)

## 2021-10-19 ENCOUNTER — Telehealth: Payer: Self-pay | Admitting: Internal Medicine

## 2021-10-19 ENCOUNTER — Other Ambulatory Visit: Payer: Self-pay | Admitting: Family

## 2021-10-19 DIAGNOSIS — E039 Hypothyroidism, unspecified: Secondary | ICD-10-CM

## 2021-10-19 NOTE — Telephone Encounter (Signed)
Attempted to call pt. No answer.  ?LDM (DPR approved) stating ok to have mammogram after ablation.  ?Advised pt to call back with any further questions.  ?

## 2021-10-19 NOTE — Telephone Encounter (Signed)
Please advise if ok to refill.  ?Last filled By Lanna Poche, PA ?

## 2021-10-19 NOTE — Telephone Encounter (Signed)
Patient asking if she can have a mammogram after just having an Ablation. Please advise  ?

## 2021-10-19 NOTE — Telephone Encounter (Signed)
?*  STAT* If patient is at the pharmacy, call can be transferred to refill team. ? ? ?1. Which medications need to be refilled? (please list name of each medication and dose if known) Nifedipine 30mg  ? ?2. Which pharmacy/location (including street and city if local pharmacy) is medication to be sent to? [facility] [address] ? ?3. Do they need a 30 day or 90 day supply? 90 day ?

## 2021-10-19 NOTE — Telephone Encounter (Signed)
Nifedipine 30 mg qd was sent in by Maxine Glenn, PA-C / Dr. Elberta Fortis 10/12/21.  ?Pt is requesting 90 day supply be sent. 30 day supply with refills given.  ?Forwarding to Dr. Gershon Crane nurse/ [address] triage to review.  ?

## 2021-10-22 MED ORDER — NIFEDIPINE ER 30 MG PO TB24: 30.0000 mg | ORAL_TABLET | Freq: Every day | ORAL | 0 refills | Status: AC

## 2021-10-22 NOTE — Telephone Encounter (Signed)
Requested Prescriptions  ? ?Signed Prescriptions Disp Refills  ? NIFEdipine (ADALAT CC) 30 MG 24 hr tablet 90 tablet 0  ?  Sig: Take 1 tablet (30 mg total) by mouth daily.  ?  Authorizing Provider: END, CHRISTOPHER  ?  Ordering User: NEWCOMER MCCLAIN, Bristal Steffy L  ? ? ?

## 2021-10-22 NOTE — Telephone Encounter (Signed)
This is a Educational psychologist pt. This medication was prescribed in the hospital. Please forward to Dr. Okey Dupre, primary cardiologist, to see if he will refill this blood pressure medicine. Please address ?

## 2021-11-08 ENCOUNTER — Encounter (HOSPITAL_COMMUNITY): Payer: Self-pay | Admitting: Nurse Practitioner

## 2021-11-08 ENCOUNTER — Ambulatory Visit (HOSPITAL_COMMUNITY)
Admission: RE | Admit: 2021-11-08 | Discharge: 2021-11-08 | Disposition: A | Discharge status: Home / Self Care | Payer: Medicare HMO | Source: Ambulatory Visit | Attending: Nurse Practitioner | Admitting: Nurse Practitioner

## 2021-11-08 VITALS — BP 140/74 | HR 71 | Ht 62.0 in | Wt 153.6 lb

## 2021-11-08 DIAGNOSIS — Z79899 Other long term (current) drug therapy: Secondary | ICD-10-CM | POA: Diagnosis not present

## 2021-11-08 DIAGNOSIS — D6869 Other thrombophilia: Secondary | ICD-10-CM | POA: Diagnosis not present

## 2021-11-08 DIAGNOSIS — Z7901 Long term (current) use of anticoagulants: Secondary | ICD-10-CM | POA: Insufficient documentation

## 2021-11-08 DIAGNOSIS — R1310 Dysphagia, unspecified: Secondary | ICD-10-CM | POA: Insufficient documentation

## 2021-11-08 DIAGNOSIS — I4819 Other persistent atrial fibrillation: Secondary | ICD-10-CM | POA: Diagnosis not present

## 2021-11-08 DIAGNOSIS — I4891 Unspecified atrial fibrillation: Secondary | ICD-10-CM | POA: Insufficient documentation

## 2021-11-08 NOTE — Progress Notes (Signed)
? ?Primary Care Physician: Gladstone Lighter, MD ?Referring Physician: Dr. Quentin Ore ? ? ?Alexis Mcintosh is a 71 y.o. female with a h/o afib s/p ablation one month ago. She is in SR. She has noted mild dysphagia which is improving since the procedure. She would like to go back to her omeprazole  which hs she felt worked better than pantoprazole. No groin issues. She has not noted any afib. Being compliant with anticoagulation.  ? ?Today, she denies symptoms of palpitations, chest pain, shortness of breath, orthopnea, PND, lower extremity edema, dizziness, presyncope, syncope, or neurologic sequela. The patient is tolerating medications without difficulties and is otherwise without complaint today.  ? ?Past Medical History:  ?Diagnosis Date  ? Atrial fibrillation ([place])   ? GERD (gastroesophageal reflux disease)   ? Thyroid disease   ? ?Past Surgical History:  ?Procedure Laterality Date  ? APPENDECTOMY    ? ATRIAL FIBRILLATION ABLATION N/A 10/11/2021  ? Procedure: ATRIAL FIBRILLATION ABLATION;  Surgeon: Vickie Epley, MD;  Location: [facility];  Service: Cardiovascular;  Laterality: N/A;  ? CARDIOVERSION N/A 05/18/2021  ? Procedure: CARDIOVERSION;  Surgeon: Minna Merritts, MD;  Location: [facility];  Service: Cardiovascular;  Laterality: N/A;  ? ? ?Current Outpatient Medications  ?Medication Sig Dispense Refill  ? acetaminophen (TYLENOL) 325 MG tablet Take 2 tablets (650 mg total) by mouth every 4 (four) hours as needed for headache or mild pain.    ? apixaban (ELIQUIS) 5 MG TABS tablet Take 1 tablet (5 mg total) by mouth 2 (two) times daily. 180 tablet 1  ? Calcium Citrate-Vitamin D (CALCIUM CITRATE + D3) 200-250 MG-UNIT TABS Take 1 tablet by mouth daily.    ? Cholecalciferol (VITAMIN D3) 2000 units TABS Take 2,000 Units by mouth daily.    ? Coenzyme Q10 200 MG capsule Take 200 mg by mouth daily.    ? cycloSPORINE (RESTASIS) 0.05 % ophthalmic emulsion Place 1 drop into both eyes 2 (two) times daily.    ?  desonide (DESOWEN) 0.05 % lotion Apply 1 application. topically 2 (two) times daily as needed (Eczema).    ? fexofenadine (ALLEGRA) 180 MG tablet Take 1 tablet (180 mg total) by mouth daily. 90 tablet 3  ? hydrOXYzine (ATARAX) 50 MG tablet Take 2 tablets (100 mg total) by mouth at bedtime. 260 tablet 3  ? levothyroxine (SYNTHROID) 100 MCG tablet TAKE 1 TABLET BY MOUTH DAILY BEFORE BREAKFAST. 30 tablet 2  ? Melatonin 5 MG TABS Take 10 mg by mouth at bedtime.    ? mometasone (NASONEX) 50 MCG/ACT nasal spray PLACE 2 SPRAYS INTO THE NOSE DAILY. 51 each 3  ? Multiple Vitamin (MULTIVITAMIN) tablet Take 1 tablet by mouth daily.    ? NIFEdipine (ADALAT CC) 30 MG 24 hr tablet Take 1 tablet (30 mg total) by mouth daily. 90 tablet 0  ? pantoprazole (PROTONIX) 40 MG tablet Take 1 tablet (40 mg total) by mouth daily. 45 tablet 0  ? tacrolimus (PROTOPIC) 0.1 % ointment Apply 1 application. topically daily as needed (Eczema).    ? ?No current facility-administered medications for this encounter.  ? ? ?Allergies  ?Allergen Reactions  ? Amoxicillin Hives  ? ? ?Social History  ? ?Socioeconomic History  ? Marital status: Married  ?  Spouse name: Not on file  ? Number of children: Not on file  ? Years of education: Not on file  ? Highest education level: Not on file  ?Occupational History  ? Not on file  ?Tobacco  Use  ? Smoking status: Never  ? Smokeless tobacco: Never  ?Vaping Use  ? Vaping Use: Never used  ?Substance and Sexual Activity  ? Alcohol use: Yes  ?  Alcohol/week: 14.0 standard drinks  ?  Types: 14 Glasses of wine per week  ?  Comment: 2 glasses per day  ? Drug use: No  ? Sexual activity: Never  ?Other Topics Concern  ? Not on file  ?Social History Narrative  ? Not on file  ? ?Social Determinants of Health  ? ?Financial Resource Strain: Not on file  ?Food Insecurity: Not on file  ?Transportation Needs: Not on file  ?Physical Activity: Not on file  ?Stress: Not on file  ?Social Connections: Not on file  ?Intimate Partner  Violence: Not on file  ? ? ?Family History  ?Problem Relation Age of Onset  ? Cancer Mother   ?     Breast  ? Hypertension Mother   ? Diabetes Mother   ? Breast cancer Mother 57  ? Atrial fibrillation Mother   ? Heart disease Father   ?     pacemaker  ? Stroke Father   ? Hypertension Father   ? HIV/AIDS Brother   ? Breast cancer Paternal Grandmother 17  ? ? ?ROS- All systems are reviewed and negative except as per the HPI above ? ?Physical Exam: ?Vitals:  ? 11/08/21 1127  ?BP: 140/74  ?Pulse: 71  ?Weight: 69.7 kg  ?Height: 5\' 2"  (1.575 m)  ? ?Wt Readings from Last 3 Encounters:  ?11/08/21 69.7 kg  ?10/11/21 68 kg  ?09/06/21 68.9 kg  ? ? ?Labs: ?Lab Results  ?Component Value Date  ? NA 138 09/24/2021  ? K 4.3 09/24/2021  ? CL 101 09/24/2021  ? CO2 29 09/24/2021  ? GLUCOSE 111 (H) 09/24/2021  ? BUN 15 09/24/2021  ? CREATININE 0.75 09/24/2021  ? CALCIUM 9.3 09/24/2021  ? MG 1.9 05/16/2021  ? ?No results found for: INR ?Lab Results  ?Component Value Date  ? CHOL 209 (H) 05/18/2019  ? HDL 84.50 05/18/2019  ? [place] 111 (H) 05/18/2019  ? TRIG 65.0 05/18/2019  ? ? ? ?GEN- The patient is well appearing, alert and oriented x 3 today.   ?Head- normocephalic, atraumatic ?Eyes-  Sclera clear, conjunctiva pink ?Ears- hearing intact ?Oropharynx- clear ?Neck- supple, no JVP ?Lymph- no cervical lymphadenopathy ?Lungs- Clear to ausculation bilaterally, normal work of breathing ?Heart- Regular rate and rhythm, no murmurs, rubs or gallops, PMI not laterally displaced ?GI- soft, NT, ND, + BS ?Extremities- no clubbing, cyanosis, or edema ?MS- no significant deformity or atrophy ?Skin- no rash or lesion ?Psych- euthymic mood, full affect ?Neuro- strength and sensation are intact ? ?EKG-Sinus rhythm at 71 bpm, pr int 150 ms, qrs int 78 ms, qtc 430 ms  ?Epic records reviewed  ? ? ?Assessment and Plan:  ?1.afib ?S/p ablation and is doing well staying in SR  ?She wants to go back to omeprazole, will be ok ? ?2. CHA2DS2VASc  score of  2 ?Continue eliquis 5 mg bid ?Reminded not to interrupt anticoagulation ? ?F/u with Dr. Quentin Ore 01/09/22  ? ?Geroge Baseman Kayleen Memos, ANP-C ?[facility] ?[facility] ?[address] ?[place], [place] [address] ?[phone]  ?

## 2021-11-08 NOTE — Addendum Note (Signed)
Encounter addended by: Learta Codding, CMA on: 11/08/2021 2:04 PM ? Actions taken: Medication long-term status modified, Order list changed, Home Medications modified, Order Reconciliation Section accessed

## 2021-12-10 DIAGNOSIS — E039 Hypothyroidism, unspecified: Secondary | ICD-10-CM | POA: Diagnosis not present

## 2021-12-17 ENCOUNTER — Other Ambulatory Visit: Payer: Self-pay | Admitting: Internal Medicine

## 2021-12-17 ENCOUNTER — Other Ambulatory Visit: Payer: Self-pay | Admitting: Family

## 2021-12-17 DIAGNOSIS — Z1231 Encounter for screening mammogram for malignant neoplasm of breast: Secondary | ICD-10-CM

## 2021-12-18 DIAGNOSIS — I4819 Other persistent atrial fibrillation: Secondary | ICD-10-CM | POA: Diagnosis not present

## 2021-12-18 DIAGNOSIS — L989 Disorder of the skin and subcutaneous tissue, unspecified: Secondary | ICD-10-CM | POA: Diagnosis not present

## 2021-12-18 DIAGNOSIS — E039 Hypothyroidism, unspecified: Secondary | ICD-10-CM | POA: Diagnosis not present

## 2022-01-09 ENCOUNTER — Ambulatory Visit: Payer: Medicare HMO | Admitting: Cardiology

## 2022-01-09 ENCOUNTER — Encounter: Payer: Self-pay | Admitting: Cardiology

## 2022-01-09 VITALS — BP 114/74 | HR 73 | Ht 62.5 in | Wt 152.0 lb

## 2022-01-09 DIAGNOSIS — I4819 Other persistent atrial fibrillation: Secondary | ICD-10-CM | POA: Diagnosis not present

## 2022-01-09 NOTE — Patient Instructions (Addendum)
Medications: Your physician recommends that you continue on your current medications as directed. Please refer to the Current Medication list given to you today. *If you need a refill on your cardiac medications before your next appointment, please call your pharmacy*  Lab Work: None. If you have labs (blood work) drawn today and your tests are completely normal, you will receive your results only by: MyChart Message (if you have MyChart) OR A paper copy in the mail If you have any lab test that is abnormal or we need to change your treatment, we will call you to review the results.  Testing/Procedures: None.  Follow-Up: At [facility], you and your health needs are our priority.  As part of our continuing mission to provide you with exceptional heart care, we have created designated Provider Care Teams.  These Care Teams include your primary Cardiologist (physician) and Advanced Practice Providers (APPs -  Physician Assistants and Nurse Practitioners) who all work together to provide you with the care you need, when you need it.  Your physician wants you to follow-up in: 12 months with one of the following Advanced Practice Providers on your designated Care Team:    Alexis Givenshris Berge, NP Alexis Listenyan Dunn PA Alexis Fransico MichaelFurth PA    You will receive a reminder letter in the mail two months in advance. If you don't receive a letter, please call our office to schedule the follow-up appointment.  And/Or Alexis Mcintosh, the Watchman Nurse Navigator, will call you after your CT once the [facility] Team has reviewed your imaging for an update on proceedings. [facility]'s direct number is [phone] if you need assistance.   We recommend signing up for the patient portal called "MyChart".  Sign up information is provided on this After Visit Summary.  MyChart is used to connect with patients for Virtual Visits (Telemedicine).  Patients are able to view lab/test results, encounter notes, upcoming appointments, etc.   Non-urgent messages can be sent to your provider as well.   To learn more about what you can do with MyChart, go to [email or web address].    Any Other Special Instructions Will Be Listed Below (If Applicable).   Left Atrial Appendage Closure Device Implantation  Left atrial appendage (LAA) closure device implantation is a procedure to put a small device in the LAA of the heart. The LAA is a small sac in the wall of the heart's left upper chamber. Blood clots can form in the LAA in people with atrial fibrillation (AFib). The device closes the LAA to help prevent a blood clot and stroke. AFib is a type of irregular or rapid heartbeat (arrhythmia). There is an increased risk of blood clots and stroke with AFib. This procedure helps to reduce that risk. Tell a health care provider about: Any allergies you have. All medicines you are taking, including vitamins, herbs, eye drops, creams, and over-the-counter medicines. Any problems you or family members have had with anesthetic medicines. Any blood disorders you have. Any surgeries you have had. Any medical conditions you have. Whether you are pregnant or may be pregnant. What are the risks? Generally, this is a safe procedure. However, problems may occur, including: Infection. Bleeding. Allergic reactions to medicines or dyes. Damage to nearby structures or organs. Heart attack. Stroke. Blood clots. Changes in heart rhythm. Device failure. What happens before the procedure? Staying hydrated Follow instructions from your health care provider about hydration, which may include: Up to 2 hours before the procedure - you may continue to drink clear liquids, such  as water, clear fruit juice, black coffee, and plain tea. Eating and drinking restrictions Follow instructions from your health care provider about eating and drinking, which may include: 8 hours before the procedure - stop eating heavy meals or foods, such as meat, fried foods, or  fatty foods. 6 hours before the procedure - stop eating light meals or foods, such as toast or cereal. 6 hours before the procedure - stop drinking milk or drinks that contain milk. 2 hours before the procedure - stop drinking clear liquids. Medicines Ask your health care provider about: Changing or stopping your regular medicines. This is especially important if you are taking diabetes medicines or blood thinners. Taking medicines such as aspirin and ibuprofen. These medicines can thin your blood. Do not take these medicines unless your health care provider tells you to take them. Taking over-the-counter medicines, vitamins, herbs, and supplements. Tests You may have blood tests and a physical exam. You may have an electrocardiogram (ECG). This test checks your heart's electrical patterns and rhythms. General instructions Do not use any products that contain nicotine or tobacco. These include cigarettes, chewing tobacco, and vaping devices, such as e-cigarettes. If you need help quitting, ask your health care provider. Ask your health care provider what steps will be taken to help prevent infection. These steps may include: Removing hair at the surgery site. Washing skin with a germ-killing soap. Taking antibiotic medicine. Plan to have a responsible adult take you home from the hospital or clinic. Plan to have a responsible adult care for you for the time you are told after you leave the hospital or clinic. This is important. What happens during the procedure? An IV will be inserted into one of your veins. You will be given one or more of the following: A medicine to help you relax (sedative). A medicine to make you fall asleep (general anesthetic). A small incision will be made in your groin area. A small wire will be put through the incision and into a blood vessel. Dye may be injected so X-rays can be used to guide the wire through the blood vessel. A long, thin tube (catheter) will  be put over the small wire and moved up through the blood vessel to reach your heart. The closure device will be moved through the catheter until it reaches your heart. A small hole will be made in the septum (transseptal puncture). The septum is a thin tissue that separates the upper two chambers of the heart. The device will be placed so that it closes the LAA. X-rays will be done to make sure the device is in the right place. The catheter and wire will be removed. The closure device will remain in your heart. After pressure is applied over the catheter site to prevent bleeding, a bandage (dressing) will be placed over the site where the catheter was inserted. The procedure may vary among health care providers and hospitals. What happens after the procedure? Your blood pressure, heart rate, breathing rate, and blood oxygen level will be monitored until you leave the hospital or clinic. You may have to wear compression stockings. These stockings help to prevent blood clots and reduce swelling in your legs. If you were given a sedative during the procedure, it can affect you for several hours. Do not drive or operate machinery until your health care provider says it is safe. You may be given pain medicine. You may need to drink more fluids to wash (flush) the dye out of your  body. Drink enough fluid to keep your urine pale yellow. Take over-the-counter and prescription medicines only as told by your health care provider. This is especially important if you were given blood thinners. Summary Left atrial appendage (LAA) closure device implantation is a procedure that is done to put a small device in the LAA of the heart. The LAA is a small sac in the wall of the heart's left upper chamber. The device closes the LAA to prevent stroke and other problems. Follow instructions from your health care provider before and after the procedure. This information is not intended to replace advice given to you by  your health care provider. Make sure you discuss any questions you have with your health care provider. Document Revised: 03/30/2020 Document Reviewed: 03/30/2020 Elsevier Patient Education  2023 [employer].

## 2022-01-09 NOTE — Progress Notes (Signed)
Electrophysiology Office Follow up Visit Note:    Date:  01/09/2022   ID:  Alexis Mcintosh, DOB 12-06-1950, MRN [id number]  PCP:  Enid Baas, MD  [facility] Cardiologist:  Yvonne Kendall, MD  [facility] Electrophysiologist:  Lanier Prude, MD    Interval History:    Alexis Mcintosh is a 71 y.o. female who presents for a follow up visit after an atrial fibrillation ablation on October 11, 2021.  During the ablation, pulmonary vein and posterior wall isolation was performed.  The CTI was also ablated.  She saw Rudi Coco in follow-up November 08, 2021 and was maintaining normal rhythm.  She is on Eliquis 5 mg by mouth twice daily.  She saw Dr. Nemiah Commander in follow-up Dec 18, 2021.  She is very interested in the Watchman device as a mechanism to avoid long-term exposure to anticoagulation.     Past Medical History:  Diagnosis Date   Atrial fibrillation (HCC)    GERD (gastroesophageal reflux disease)    Thyroid disease     Past Surgical History:  Procedure Laterality Date   APPENDECTOMY     ATRIAL FIBRILLATION ABLATION N/A 10/11/2021   Procedure: ATRIAL FIBRILLATION ABLATION;  Surgeon: Lanier Prude, MD;  Location: [facility];  Service: Cardiovascular;  Laterality: N/A;   CARDIOVERSION N/A 05/18/2021   Procedure: CARDIOVERSION;  Surgeon: Antonieta Iba, MD;  Location: [facility] ORS;  Service: Cardiovascular;  Laterality: N/A;    Current Medications: Current Meds  Medication Sig   acetaminophen (TYLENOL) 325 MG tablet Take 2 tablets (650 mg total) by mouth every 4 (four) hours as needed for headache or mild pain.   apixaban (ELIQUIS) 5 MG TABS tablet Take 1 tablet (5 mg total) by mouth 2 (two) times daily.   Calcium Citrate-Vitamin D (CALCIUM CITRATE + D3) 200-250 MG-UNIT TABS Take 1 tablet by mouth daily.   Cholecalciferol (VITAMIN D3) 2000 units TABS Take 2,000 Units by mouth daily.   Coenzyme Q10 200 MG capsule Take 200 mg by mouth daily.   cycloSPORINE  (RESTASIS) 0.05 % ophthalmic emulsion Place 1 drop into both eyes 2 (two) times daily.   desonide (DESOWEN) 0.05 % lotion Apply 1 application. topically 2 (two) times daily as needed (Eczema).   fexofenadine (ALLEGRA) 180 MG tablet Take 1 tablet (180 mg total) by mouth daily.   hydrOXYzine (ATARAX) 50 MG tablet Take 2 tablets (100 mg total) by mouth at bedtime.   levothyroxine (SYNTHROID) 100 MCG tablet TAKE 1 TABLET BY MOUTH DAILY BEFORE BREAKFAST.   Melatonin 5 MG TABS Take 10 mg by mouth at bedtime.   mometasone (NASONEX) 50 MCG/ACT nasal spray PLACE 2 SPRAYS INTO THE NOSE DAILY.   Multiple Vitamin (MULTIVITAMIN) tablet Take 1 tablet by mouth daily.   NIFEdipine (ADALAT CC) 30 MG 24 hr tablet Take 1 tablet (30 mg total) by mouth daily.   omeprazole (PRILOSEC) 20 MG capsule Take 20 mg by mouth daily.   tacrolimus (PROTOPIC) 0.1 % ointment Apply 1 application. topically daily as needed (Eczema).     Allergies:   Amoxicillin   Social History   Socioeconomic History   Marital status: Married    Spouse name: Not on file   Number of children: Not on file   Years of education: Not on file   Highest education level: Not on file  Occupational History   Not on file  Tobacco Use   Smoking status: Never   Smokeless tobacco: Never  Vaping Use   Vaping  Use: Never used  Substance and Sexual Activity   Alcohol use: Yes    Alcohol/week: 14.0 standard drinks    Types: 14 Glasses of wine per week    Comment: 2 glasses per day   Drug use: No   Sexual activity: Never  Other Topics Concern   Not on file  Social History Narrative   Not on file   Social Determinants of Health   Financial Resource Strain: Not on file  Food Insecurity: Not on file  Transportation Needs: Not on file  Physical Activity: Not on file  Stress: Not on file  Social Connections: Not on file     Family History: The patient's family history includes Atrial fibrillation in her mother; Breast cancer (age of onset:  4162) in her mother; Breast cancer (age of onset: 3870) in her paternal grandmother; Cancer in her mother; Diabetes in her mother; HIV/AIDS in her brother; Heart disease in her father; Hypertension in her father and mother; Stroke in her father.  ROS:   Please see the history of present illness.    All other systems reviewed and are negative.  EKGs/Labs/Other Studies Reviewed:    The following studies were reviewed today:   EKG:  The ekg ordered today demonstrates sinus rhythm with a competing junctional rhythm  Recent Labs: 05/16/2021: ALT 27; Magnesium 1.9 06/01/2021: TSH 3.990 09/24/2021: BUN 15; Creatinine, Ser 0.75; Hemoglobin 14.2; Platelets 252; Potassium 4.3; Sodium 138  Recent Lipid Panel    Component Value Date/Time   CHOL 209 (H) 05/18/2019 0859   TRIG 65.0 05/18/2019 0859   HDL 84.50 05/18/2019 0859   CHOLHDL 2 05/18/2019 0859   VLDL 13.0 05/18/2019 0859   LDLCALC 111 (H) 05/18/2019 0859    Physical Exam:    VS:  BP 114/74   Pulse 73   Ht 5' 2.5" (1.588 m)   Wt 152 lb (68.9 kg)   SpO2 92%   BMI 27.36 kg/m     Wt Readings from Last 3 Encounters:  01/09/22 152 lb (68.9 kg)  11/08/21 153 lb 9.6 oz (69.7 kg)  10/11/21 150 lb (68 kg)     GEN:  Well nourished, well developed in no acute distress HEENT: Normal NECK: No JVD; No carotid bruits LYMPHATICS: No lymphadenopathy CARDIAC: RRR, no murmurs, rubs, gallops RESPIRATORY:  Clear to auscultation without rales, wheezing or rhonchi  ABDOMEN: Soft, non-tender, non-distended MUSCULOSKELETAL:  No edema; No deformity  SKIN: Warm and dry NEUROLOGIC:  Alert and oriented x 3 PSYCHIATRIC:  Normal affect        ASSESSMENT:    1. Persistent atrial fibrillation (HCC)    PLAN:    In order of problems listed above:  #Persistent atrial fibrillation Doing well after her A-fib ablation.  Maintaining normal rhythm.  On Eliquis for stroke prophylaxis.  Long discussion with the patient today about her stroke risk  and anticoagulation.  She is very interested in pursuing a stroke risk mitigation strategy that avoids long-term exposure anticoagulation.  She inquires about the Watchman device.  She currently has a CHA2DS2-VASc of 2 for age and gender which is less than the cutoff for watchman implant candidacy.  We discussed how this is a current recommendation and this may change moving forward.  For now, I would recommend continuing Eliquis given we are still very early after her ablation procedure.  I would favor continuing the anticoagulation for at least 1 year.  If maintaining normal rhythm for the first year after ablation, could consider stopping  the blood thinner as long as there is a A-fib surveillance strategy in place.  -------------  I did discuss the Watchman procedure in detail today including the risks, recovery and likelihood of a successful implant. Procedural risks for the Watchman implant have been reviewed with the patient including a 0.5% risk of stroke, <1% risk of perforation and <1% risk of device embolization.    The published clinical data on the safety and effectiveness of WATCHMAN include but are not limited to the following: - Holmes DR, Everlene Farrier, Sick P et al. for the PROTECT AF Investigators. Percutaneous closure of the left atrial appendage versus warfarin therapy for prevention of stroke in patients with atrial fibrillation: a randomised non-inferiority trial. Lancet 2009; 374: 534-42. Everlene Farrier, Doshi SK, Isa Rankin D et al. on behalf of the PROTECT AF Investigators. Percutaneous Left Atrial Appendage Closure for Stroke Prophylaxis in Patients With Atrial Fibrillation 2.3-Year Follow-up of the PROTECT AF (Watchman Left Atrial Appendage System for Embolic Protection in Patients With Atrial Fibrillation) Trial. Circulation 2013; 127:720-729. - Alli O, Doshi S,  Kar S, Reddy VY, Sievert H et al. Quality of Life Assessment in the Randomized PROTECT AF (Percutaneous Closure of  the Left Atrial Appendage Versus Warfarin Therapy for Prevention of Stroke in Patients With Atrial Fibrillation) Trial of Patients at Risk for Stroke With Nonvalvular Atrial Fibrillation. J Am Coll Cardiol 2013; 61:1790-8. Aline August DR, Mia Creek, Price M, Whisenant B, Sievert H, Doshi S, Huber K, Reddy V. Prospective randomized evaluation of the Watchman left atrial appendage Device in patients with atrial fibrillation versus long-term warfarin therapy; the PREVAIL trial. Journal of the [employer] of Cardiology, Vol. 4, No. 1, 2014, 1-11. - Kar S, Doshi SK, Sadhu A, Horton R, Osorio J et al. Primary outcome evaluation of a next-generation left atrial appendage closure device: results from the PINNACLE FLX trial. Circulation 2021;143(18)1754-1762.   HAS-BLED score 1 Hypertension No  Abnormal renal and liver function (Dialysis, transplant, Cr >2.26 mg/dL /Cirrhosis or Bilirubin >2x Normal or AST/ALT/AP >3x Normal) No  Stroke No  Bleeding No  Labile INR (Unstable/high INR) No  Elderly (>65) Yes  Drugs or alcohol (? 8 drinks/week, anti-plt or NSAID) No   CHA2DS2-VASc Score = 2  The patient's score is based upon: CHF History: 0 HTN History: 0 Diabetes History: 0 Stroke History: 0 Vascular Disease History: 0 Age Score: 1 Gender Score: 1    Follow up 1 year with APP.    Total time spent with patient today 45 minutes. This includes reviewing records, evaluating the patient and coordinating care.   Medication Adjustments/Labs and Tests Ordered: Current medicines are reviewed at length with the patient today.  Concerns regarding medicines are outlined above.  Orders Placed This Encounter  Procedures   EKG 12-Lead   No orders of the defined types were placed in this encounter.    Signed, Steffanie Dunn, MD, [facility], [facility] 01/09/2022 12:04 PM    Electrophysiology [facility]

## 2022-01-16 ENCOUNTER — Ambulatory Visit
Admission: RE | Admit: 2022-01-16 | Discharge: 2022-01-16 | Disposition: A | Discharge status: Home / Self Care | Payer: Medicare HMO | Source: Ambulatory Visit | Attending: Internal Medicine | Admitting: Internal Medicine

## 2022-01-16 DIAGNOSIS — Z1231 Encounter for screening mammogram for malignant neoplasm of breast: Secondary | ICD-10-CM | POA: Diagnosis not present

## 2022-01-28 DIAGNOSIS — J019 Acute sinusitis, unspecified: Secondary | ICD-10-CM | POA: Diagnosis not present

## 2022-01-28 DIAGNOSIS — H6693 Otitis media, unspecified, bilateral: Secondary | ICD-10-CM | POA: Diagnosis not present

## 2022-01-28 DIAGNOSIS — J040 Acute laryngitis: Secondary | ICD-10-CM | POA: Diagnosis not present

## 2022-02-12 DIAGNOSIS — H6693 Otitis media, unspecified, bilateral: Secondary | ICD-10-CM | POA: Diagnosis not present

## 2022-02-12 DIAGNOSIS — J029 Acute pharyngitis, unspecified: Secondary | ICD-10-CM | POA: Diagnosis not present

## 2022-03-18 DIAGNOSIS — E039 Hypothyroidism, unspecified: Secondary | ICD-10-CM | POA: Diagnosis not present

## 2022-03-25 DIAGNOSIS — E039 Hypothyroidism, unspecified: Secondary | ICD-10-CM | POA: Diagnosis not present

## 2022-05-16 DIAGNOSIS — I4819 Other persistent atrial fibrillation: Secondary | ICD-10-CM | POA: Diagnosis not present

## 2022-05-16 DIAGNOSIS — Z780 Asymptomatic menopausal state: Secondary | ICD-10-CM | POA: Diagnosis not present

## 2022-05-16 DIAGNOSIS — R739 Hyperglycemia, unspecified: Secondary | ICD-10-CM | POA: Diagnosis not present

## 2022-05-16 DIAGNOSIS — E039 Hypothyroidism, unspecified: Secondary | ICD-10-CM | POA: Diagnosis not present

## 2022-05-23 DIAGNOSIS — Z23 Encounter for immunization: Secondary | ICD-10-CM | POA: Diagnosis not present

## 2022-05-23 DIAGNOSIS — E039 Hypothyroidism, unspecified: Secondary | ICD-10-CM | POA: Diagnosis not present

## 2022-05-23 DIAGNOSIS — K219 Gastro-esophageal reflux disease without esophagitis: Secondary | ICD-10-CM | POA: Diagnosis not present

## 2022-05-23 DIAGNOSIS — I4819 Other persistent atrial fibrillation: Secondary | ICD-10-CM | POA: Diagnosis not present

## 2022-05-23 DIAGNOSIS — Z0000 Encounter for general adult medical examination without abnormal findings: Secondary | ICD-10-CM | POA: Diagnosis not present

## 2022-06-03 DIAGNOSIS — H353131 Nonexudative age-related macular degeneration, bilateral, early dry stage: Secondary | ICD-10-CM | POA: Diagnosis not present

## 2022-09-12 ENCOUNTER — Encounter (HOSPITAL_COMMUNITY): Payer: Self-pay | Admitting: *Deleted

## 2022-09-26 DIAGNOSIS — E039 Hypothyroidism, unspecified: Secondary | ICD-10-CM | POA: Diagnosis not present

## 2022-10-03 DIAGNOSIS — E039 Hypothyroidism, unspecified: Secondary | ICD-10-CM | POA: Diagnosis not present

## 2023-01-25 DIAGNOSIS — L0201 Cutaneous abscess of face: Secondary | ICD-10-CM | POA: Diagnosis not present

## 2023-03-27 DIAGNOSIS — E039 Hypothyroidism, unspecified: Secondary | ICD-10-CM | POA: Diagnosis not present

## 2023-04-02 ENCOUNTER — Other Ambulatory Visit: Payer: Self-pay | Admitting: Internal Medicine

## 2023-04-02 DIAGNOSIS — Z1231 Encounter for screening mammogram for malignant neoplasm of breast: Secondary | ICD-10-CM

## 2023-04-03 DIAGNOSIS — E039 Hypothyroidism, unspecified: Secondary | ICD-10-CM | POA: Diagnosis not present

## 2023-04-11 ENCOUNTER — Ambulatory Visit
Admission: RE | Admit: 2023-04-11 | Discharge: 2023-04-11 | Disposition: A | Discharge status: Home / Self Care | Payer: Medicare HMO | Source: Ambulatory Visit | Attending: Internal Medicine | Admitting: Internal Medicine

## 2023-04-11 DIAGNOSIS — Z1231 Encounter for screening mammogram for malignant neoplasm of breast: Secondary | ICD-10-CM | POA: Insufficient documentation

## 2023-05-19 DIAGNOSIS — E559 Vitamin D deficiency, unspecified: Secondary | ICD-10-CM | POA: Diagnosis not present

## 2023-05-26 DIAGNOSIS — E039 Hypothyroidism, unspecified: Secondary | ICD-10-CM | POA: Diagnosis not present

## 2023-05-26 DIAGNOSIS — Z23 Encounter for immunization: Secondary | ICD-10-CM | POA: Diagnosis not present

## 2023-05-26 DIAGNOSIS — Z79899 Other long term (current) drug therapy: Secondary | ICD-10-CM | POA: Diagnosis not present

## 2023-05-26 DIAGNOSIS — Z1331 Encounter for screening for depression: Secondary | ICD-10-CM | POA: Diagnosis not present

## 2023-05-26 DIAGNOSIS — I4819 Other persistent atrial fibrillation: Secondary | ICD-10-CM | POA: Diagnosis not present

## 2023-05-26 DIAGNOSIS — Z0000 Encounter for general adult medical examination without abnormal findings: Secondary | ICD-10-CM | POA: Diagnosis not present

## 2023-05-26 DIAGNOSIS — K219 Gastro-esophageal reflux disease without esophagitis: Secondary | ICD-10-CM | POA: Diagnosis not present

## 2023-06-06 DIAGNOSIS — H04123 Dry eye syndrome of bilateral lacrimal glands: Secondary | ICD-10-CM | POA: Diagnosis not present

## 2023-06-06 DIAGNOSIS — H353131 Nonexudative age-related macular degeneration, bilateral, early dry stage: Secondary | ICD-10-CM | POA: Diagnosis not present

## 2023-06-06 DIAGNOSIS — H2513 Age-related nuclear cataract, bilateral: Secondary | ICD-10-CM | POA: Diagnosis not present

## 2023-06-06 DIAGNOSIS — H43813 Vitreous degeneration, bilateral: Secondary | ICD-10-CM | POA: Diagnosis not present

## 2023-08-19 NOTE — Progress Notes (Signed)
  Electrophysiology Office Follow up Visit Note:    Date:  08/20/2023   ID:  Alexis Mcintosh, DOB 1951/01/26, MRN [id number]  PCP:  Rex Castor, MD  [facility] Cardiologist:  Sammy Crisp, MD  [facility] Electrophysiologist:  Boyce Byes, MD    Interval History:     Alexis Mcintosh is a 73 y.o. female who presents for a follow up visit.   I last saw the patient January 09, 2022.  She had a A-fib ablation October 11, 2021.  At the last appointment the patient expressed an interest in left atrial appendage occlusion.  Her CHA2DS2-VASc was calculated is 2 for age and gender which made her not a candidate at the time for left atrial appendage occlusion.  She has been doing well. Today her BP is elevated. She has a cuff at home but doesn't use it regularly. Today she reports rare palpitations, a significant improvement over her prior AF burden.       Past medical, surgical, social and family history were reviewed.  ROS:   Please see the history of present illness.    All other systems reviewed and are negative.  EKGs/Labs/Other Studies Reviewed:    The following studies were reviewed today:     EKG Interpretation Date/Time:  Wednesday August 20 2023 11:36:59 EST Ventricular Rate:  66 PR Interval:  158 QRS Duration:  76 QT Interval:  394 QTC Calculation: 413 R Axis:   16  Text Interpretation: Normal sinus rhythm Confirmed by Harvie Liner [phone]) on 08/20/2023 12:15:23 PM    Physical Exam:    VS:  BP (!) 144/70 (BP Location: Left Arm, Patient Position: Sitting, Cuff Size: Normal)   Pulse 66   Ht 5' 2.5" (1.588 m)   Wt 152 lb 4 oz (69.1 kg)   SpO2 97%   BMI 27.40 kg/m     Wt Readings from Last 3 Encounters:  08/20/23 152 lb 4 oz (69.1 kg)  01/09/22 152 lb (68.9 kg)  11/08/21 153 lb 9.6 oz (69.7 kg)     GEN: no distress CARD: RRR, No MRG RESP: No IWOB. CTAB.      ASSESSMENT:    1. Persistent atrial fibrillation (HCC)    PLAN:    In  order of problems listed above:  #Persistent atrial fibrillation Doing well after catheter ablation 2023.  Maintaining sinus rhythm.  On Eliquis  for stroke prophylaxis.  I reviewed left atrial appendage occlusion indications and candidacy during today's appointment.  Given a CHA2DS2-VASc of 2, her stroke risk is actually too low to qualify for the Watchman device at this time.  This was discussed at length.  For now, I have recommended continuing Eliquis  for stroke prophylaxis. It is possible her BP is increasing and I Have asked her to check it routinely at home.  CHA2DS2-VASc Score = 2  The patient's score is based upon: CHF History: 0 HTN History: 0 Diabetes History: 0 Stroke History: 0 Vascular Disease History: 0 Age Score: 1 Gender Score: 1  Follow-up with the EP APP in 1 year.   Signed, Harvie Liner, MD, [facility], [facility] 08/20/2023 12:15 PM    Electrophysiology [facility]

## 2023-08-20 ENCOUNTER — Ambulatory Visit: Payer: Medicare Other | Attending: Cardiology | Admitting: Cardiology

## 2023-08-20 ENCOUNTER — Encounter: Payer: Self-pay | Admitting: Cardiology

## 2023-08-20 VITALS — BP 144/70 | HR 66 | Ht 62.5 in | Wt 152.2 lb

## 2023-08-20 DIAGNOSIS — I4819 Other persistent atrial fibrillation: Secondary | ICD-10-CM | POA: Diagnosis not present

## 2023-08-20 NOTE — Patient Instructions (Signed)
 Medication Instructions:  Your physician recommends that you continue on your current medications as directed. Please refer to the Current Medication list given to you today.  *If you need a refill on your cardiac medications before your next appointment, please call your pharmacy*  Follow-Up: At [facility], you and your health needs are our priority.  As part of our continuing mission to provide you with exceptional heart care, we have created designated Provider Care Teams.  These Care Teams include your primary Cardiologist (physician) and Advanced Practice Providers (APPs -  Physician Assistants and Nurse Practitioners) who all work together to provide you with the care you need, when you need it.    Your next appointment:   1 year  Provider:   Sherie Don, NP

## 2023-09-29 DIAGNOSIS — E039 Hypothyroidism, unspecified: Secondary | ICD-10-CM | POA: Diagnosis not present

## 2023-12-08 ENCOUNTER — Telehealth: Payer: Self-pay | Admitting: Cardiology

## 2023-12-08 DIAGNOSIS — I4819 Other persistent atrial fibrillation: Secondary | ICD-10-CM

## 2023-12-08 MED ORDER — APIXABAN 5 MG PO TABS: 5.0000 mg | ORAL_TABLET | Freq: Two times a day (BID) | ORAL | 1 refills | Status: DC

## 2023-12-08 NOTE — Telephone Encounter (Signed)
 Pt last saw Dr Marven Slimmer 08/20/23, last labs 05/19/23 Creat 0.7, age 73, weight 69.1kg, based on specified criteria pt is on appropriate dosage of Eliquis  5mg  BID for afib.  Will refill rx.

## 2023-12-08 NOTE — Telephone Encounter (Signed)
*  STAT* If patient is at the pharmacy, call can be transferred to refill team.   1. Which medications need to be refilled? (please list name of each medication and dose if known) apixaban  (ELIQUIS ) 5 MG TABS tablet    2. Would you like to learn more about the convenience, safety, & potential cost savings by using the [facility]?    3. Are you open to using the [facility] (Type [facility].  ).   4. Which pharmacy/location (including street and city if local pharmacy) is medication to be sent to? [id number] - [place], [place] - [address]    5. Do they need a 30 day or 90 day supply? 90 day   Pt out of medication

## 2024-03-05 ENCOUNTER — Other Ambulatory Visit: Payer: Self-pay | Admitting: Cardiology

## 2024-03-05 DIAGNOSIS — I4819 Other persistent atrial fibrillation: Secondary | ICD-10-CM

## 2024-03-05 NOTE — Telephone Encounter (Signed)
 Prescription refill request for Eliquis  received. Indication:afib Last office visit:1/25 Scr:0.7  10/24 Age: 73 Weight:69.1  kg  Prescription refilled

## 2024-04-26 ENCOUNTER — Other Ambulatory Visit: Payer: Self-pay | Admitting: Internal Medicine

## 2024-04-26 DIAGNOSIS — Z1231 Encounter for screening mammogram for malignant neoplasm of breast: Secondary | ICD-10-CM

## 2024-05-20 ENCOUNTER — Ambulatory Visit
Admission: RE | Admit: 2024-05-20 | Discharge: 2024-05-20 | Disposition: A | Discharge status: Home / Self Care | Source: Ambulatory Visit | Attending: Internal Medicine | Admitting: Internal Medicine

## 2024-05-20 DIAGNOSIS — Z1231 Encounter for screening mammogram for malignant neoplasm of breast: Secondary | ICD-10-CM | POA: Diagnosis present

## 2024-08-20 ENCOUNTER — Ambulatory Visit: Admitting: Cardiology

## 2024-09-08 ENCOUNTER — Ambulatory Visit: Admitting: Cardiology

## 2024-09-08 ENCOUNTER — Encounter: Payer: Self-pay | Admitting: Cardiology

## 2024-09-08 VITALS — BP 122/72 | HR 74 | Ht 62.0 in | Wt 149.4 lb

## 2024-09-08 DIAGNOSIS — I4819 Other persistent atrial fibrillation: Secondary | ICD-10-CM

## 2024-09-08 DIAGNOSIS — D6869 Other thrombophilia: Secondary | ICD-10-CM

## 2024-09-08 MED ORDER — APIXABAN 5 MG PO TABS: 5.0000 mg | ORAL_TABLET | Freq: Two times a day (BID) | ORAL | 3 refills | Status: AC

## 2024-09-08 NOTE — Patient Instructions (Signed)
 Medication Instructions:  Your physician recommends that you continue on your current medications as directed. Please refer to the Current Medication list given to you today.  *If you need a refill on your cardiac medications before your next appointment, please call your pharmacy*  Follow-Up: At [facility], you and your health needs are our priority.  As part of our continuing mission to provide you with exceptional heart care, our providers are all part of one team.  This team includes your primary Cardiologist (physician) and Advanced Practice Providers or APPs (Physician Assistants and Nurse Practitioners) who all work together to provide you with the care you need, when you need it.  Your next appointment:   1 year  Provider:   You will see one of the following Advanced Practice Providers on your designated Care Team:   Charlies Arthur, [place] Ozell Jodie Passey, PA-C Suzann Riddle, NP Daphne Barrack, NP Artist Pouch, PA-C  Odis Phoenix, PA-C
